# Patient Record
Sex: Female | Born: 1960 | Race: White | Hispanic: No | Marital: Married | Smoking: Never smoker
Health system: Southern US, Community
[De-identification: ages and names within clinical notes are randomized; demographics above are authoritative.]

## PROBLEM LIST (undated history)

## (undated) DIAGNOSIS — I10 Essential (primary) hypertension: Secondary | ICD-10-CM

---

## 2011-02-17 DIAGNOSIS — I10 Essential (primary) hypertension: Secondary | ICD-10-CM | POA: Insufficient documentation

## 2011-02-17 DIAGNOSIS — G47419 Narcolepsy without cataplexy: Secondary | ICD-10-CM | POA: Insufficient documentation

## 2011-06-25 DIAGNOSIS — G4730 Sleep apnea, unspecified: Secondary | ICD-10-CM | POA: Insufficient documentation

## 2011-09-15 DIAGNOSIS — N3941 Urge incontinence: Secondary | ICD-10-CM | POA: Insufficient documentation

## 2011-12-17 DIAGNOSIS — N398 Other specified disorders of urinary system: Secondary | ICD-10-CM | POA: Insufficient documentation

## 2012-08-16 DIAGNOSIS — J309 Allergic rhinitis, unspecified: Secondary | ICD-10-CM | POA: Insufficient documentation

## 2013-03-22 DIAGNOSIS — M1230 Palindromic rheumatism, unspecified site: Secondary | ICD-10-CM | POA: Insufficient documentation

## 2013-06-29 DIAGNOSIS — R079 Chest pain, unspecified: Secondary | ICD-10-CM | POA: Insufficient documentation

## 2014-11-18 DIAGNOSIS — C50919 Malignant neoplasm of unspecified site of unspecified female breast: Secondary | ICD-10-CM | POA: Insufficient documentation

## 2015-07-31 ENCOUNTER — Ambulatory Visit (INDEPENDENT_AMBULATORY_CARE_PROVIDER_SITE_OTHER): Payer: 59

## 2015-07-31 ENCOUNTER — Encounter: Payer: Self-pay | Admitting: Podiatry

## 2015-07-31 ENCOUNTER — Ambulatory Visit: Payer: Self-pay

## 2015-07-31 ENCOUNTER — Ambulatory Visit (INDEPENDENT_AMBULATORY_CARE_PROVIDER_SITE_OTHER): Payer: 59 | Admitting: Podiatry

## 2015-07-31 ENCOUNTER — Telehealth: Payer: Self-pay | Admitting: *Deleted

## 2015-07-31 VITALS — BP 135/69 | HR 69 | Resp 16

## 2015-07-31 DIAGNOSIS — E785 Hyperlipidemia, unspecified: Secondary | ICD-10-CM | POA: Insufficient documentation

## 2015-07-31 DIAGNOSIS — M722 Plantar fascial fibromatosis: Secondary | ICD-10-CM

## 2015-07-31 DIAGNOSIS — K760 Fatty (change of) liver, not elsewhere classified: Secondary | ICD-10-CM | POA: Insufficient documentation

## 2015-07-31 DIAGNOSIS — G4733 Obstructive sleep apnea (adult) (pediatric): Secondary | ICD-10-CM | POA: Insufficient documentation

## 2015-07-31 MED ORDER — METHYLPREDNISOLONE 4 MG PO TBPK: ORAL_TABLET | ORAL | Status: DC

## 2015-07-31 MED ORDER — MELOXICAM 15 MG PO TABS: 15.0000 mg | ORAL_TABLET | Freq: Every day | ORAL | Status: DC

## 2015-07-31 NOTE — Telephone Encounter (Signed)
CALLED PATIENT AND LEFT A MESSAGE STATING THAT THE PLANTAR FASCIA BRACES ARE HERE IN [place] (PATIENT WAS IN TO SEE DR HYATT TODAY). Courtney Glover

## 2015-07-31 NOTE — Patient Instructions (Signed)

## 2015-07-31 NOTE — Progress Notes (Signed)
   Subjective:    Patient ID: Courtney Glover, female    DOB: 08/11/1961, 54 y.o.   MRN: [id number]  HPI: She presents today as a 54 year old white female with a chief complaint of a one-year duration of pain to the right foot greater than the left. She states the pain is throbbing in nature along the medial longitudinal arch extending into the heel. 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 She states that she has been to see an orthopedist who recommended inserts and physical therapy. She states this seems did not seem to help. Currently she utilizes a Associate Professor.    Review of Systems  Musculoskeletal: Positive for gait problem.  All other systems reviewed and are negative.      Objective:   Physical Exam: 54 year old female in apparent good health vital signs stable alert and oriented 3. Pulses are strongly palpable neurologic sensorium is intact per Semmes-Weinstein monofilament. Deep tendon reflexes are intact bilateral and muscle strength was 5 over 5 dorsiflexion plantar flexors and inverters everters all intrinsic musculature is intact. Orthopedic evaluation demonstrates all joints distal to the ankle of full range of motion without crepitation. She has pain on palpation medial calcaneal tubercles bilateral. Orthopedic demonstrates no other significant findings. Radiographic evaluation does demonstrate soft tissue increase in density at the plantar fascial calcaneal insertion site of the bilateral heels. Cutaneous evaluation does not demonstrate any type of open lesions or wounds no cellulitis drainage or odor.      Assessment & Plan:  Assessment: Plantar fasciitis bilateral.  Plan: Discussed etiology pathology conservative versus surgical therapies. I encouraged her to continue to use her [place] sock. Started her on a Medrol Dosepak to be followed by meloxicam. I injected the bilateral heels today with Kenalog and local anesthetic and  dispensed to plantar fascial braces. We discussed appropriate shoe gear stretching exercises ice therapy as she can modifications I will follow up with her in 1 month.

## 2015-09-02 ENCOUNTER — Encounter: Payer: Self-pay | Admitting: Podiatry

## 2015-09-02 ENCOUNTER — Ambulatory Visit (INDEPENDENT_AMBULATORY_CARE_PROVIDER_SITE_OTHER): Payer: 59 | Admitting: Podiatry

## 2015-09-02 DIAGNOSIS — L603 Nail dystrophy: Secondary | ICD-10-CM | POA: Diagnosis not present

## 2015-09-02 DIAGNOSIS — M722 Plantar fascial fibromatosis: Secondary | ICD-10-CM

## 2015-09-02 NOTE — Progress Notes (Signed)
She presents today for a follow-up of her bilateral plantar fasciitis. She states that the left foot is approximately 90-95% improved. The right foot however is only approximately 50% improved. She purchased a new pair of new balance tennis shoes from the new balance store in [place]. She stated that the employees at the sporting good store suggested that she not wear her plantar fascial braces. So she has not worn them since they provided her with her new tennis shoes. She also states that she has not worn her [place]ussberg sock. She continues her medication. She is also concerned about discoloration of the hallux second and third nail plates right foot. She states that they have been discolored and thickened for quite some time and is concerned that she may have fungus.  Objective: Vital signs are stable she is alert and oriented 3. Pulses are strongly palpable bilateral. No reproducible pain on palpation medial calcaneal heel left. However the right foot does do a straight pain on palpation medial calcaneal tubercle right foot. No open lesions or wounds. All strength is normal. Cutaneous evaluation also demonstrates thick yellow possibly dystrophic or mycotic nail plate hallux second digit and third digit of the right foot.  Assessment: Nail dystrophy right foot. Well-healing plantar fasciitis left foot. Continue plantar fasciitis right foot.  Plan: Reinjected right heel today with Kenalog and local anesthetic. Sampled the nail and skin sent for pathologic evaluation. And I will follow-up with her once those labs have arrived.

## 2015-10-21 ENCOUNTER — Ambulatory Visit: Payer: 59 | Admitting: Podiatry

## 2016-01-20 ENCOUNTER — Telehealth: Payer: Self-pay | Admitting: *Deleted

## 2016-01-20 MED ORDER — MELOXICAM 15 MG PO TABS: 15.0000 mg | ORAL_TABLET | Freq: Every day | ORAL | Status: DC

## 2016-01-20 NOTE — Telephone Encounter (Signed)
Refill of Meloxicam.

## 2016-03-09 ENCOUNTER — Ambulatory Visit: Payer: 59 | Admitting: Podiatry

## 2016-03-16 ENCOUNTER — Ambulatory Visit: Payer: 59 | Admitting: Podiatry

## 2016-03-23 ENCOUNTER — Ambulatory Visit (INDEPENDENT_AMBULATORY_CARE_PROVIDER_SITE_OTHER): Payer: BLUE CROSS/BLUE SHIELD | Admitting: Podiatry

## 2016-03-23 ENCOUNTER — Encounter: Payer: Self-pay | Admitting: Podiatry

## 2016-03-23 DIAGNOSIS — M722 Plantar fascial fibromatosis: Secondary | ICD-10-CM | POA: Diagnosis not present

## 2016-03-23 NOTE — Progress Notes (Signed)
She presents today with chief complaint of bilateral heel pain. She states that on each shots and a night splint. She states that she is going have to do something more long-term because this is just bothering her she hasn't been here since January.  Objective: Vital signs are stable alert and oriented 3. Pulses are palpable. Chest pain on palpation medially continue tubercles bilateral. Left seems to be worse in the right. Vital signs are stable she is alert and oriented 3. Pulses are palpable neurologic sensorium is intact pain stress.  Assessment: Chronic intractable plantar fasciitis bilateral.  Plan: Injected the bilateral heels today. Dispensed a night splint for the left leg. She was scanned for orthotics.

## 2016-03-26 ENCOUNTER — Encounter: Payer: Self-pay | Admitting: *Deleted

## 2016-06-29 ENCOUNTER — Ambulatory Visit (INDEPENDENT_AMBULATORY_CARE_PROVIDER_SITE_OTHER): Payer: BLUE CROSS/BLUE SHIELD | Admitting: Podiatry

## 2016-06-29 DIAGNOSIS — M722 Plantar fascial fibromatosis: Secondary | ICD-10-CM | POA: Diagnosis not present

## 2016-06-30 ENCOUNTER — Telehealth: Payer: Self-pay | Admitting: *Deleted

## 2016-06-30 DIAGNOSIS — M722 Plantar fascial fibromatosis: Secondary | ICD-10-CM

## 2016-06-30 NOTE — Telephone Encounter (Addendum)
Faxed MRI orders to [facility]. 07/17/2016-Don[facility] states pt has Spinal cord stimulator and they can not perform MRI. I asked if they would perform CT and she said it may not show what Dr. Milinda Pointer is looking for. I ordered US of left foot. Faxed [place] order to [facility].

## 2016-06-30 NOTE — Progress Notes (Signed)
She presents today with little relief from her plantar fasciitis bilaterally. She's had multiple injections tried anti-inflammatories stretching and bracing to no avail. She also tried wearing her orthotics which states that they bother her.  Objective: Vital signs are stable alert and oriented 3. Pulses are strongly palpable. Neurologic system is intact. Deep tendon reflexes are intact. Muscle strength is 5 over 5 dorsi/plantar flexors and inverters everters all musculatures intact. Orthopedic evaluation demonstrates pain on palpation mucoid degenerative tubercle left greater than right.  Assessment: Severe intractable plantar fasciitis bilateral conservative therapies fail.  Plan: Requesting MRI of her left foot at this time suspect severe plantar fascitis. Once this comes back we will notify her as to our new treatment plan.

## 2016-07-17 ENCOUNTER — Ambulatory Visit: Admission: RE | Admit: 2016-07-17 | Payer: BLUE CROSS/BLUE SHIELD | Source: Ambulatory Visit

## 2016-07-29 ENCOUNTER — Encounter: Payer: Self-pay | Admitting: Podiatry

## 2016-07-29 ENCOUNTER — Ambulatory Visit (INDEPENDENT_AMBULATORY_CARE_PROVIDER_SITE_OTHER): Payer: BLUE CROSS/BLUE SHIELD | Admitting: Podiatry

## 2016-07-29 DIAGNOSIS — M722 Plantar fascial fibromatosis: Secondary | ICD-10-CM | POA: Diagnosis not present

## 2016-07-29 NOTE — Progress Notes (Signed)
She presents today for follow-up of her plantar fasciitis. She was unable to have an MRI because of the stimulator in her spine and a CT will not work for we need to see. I did request a ultrasound to be performed however they never contacted her for scheduling.  Objective: Vital signs are stable she's alert 3 severe pain on palpation medially intervals bilateral. pulses remain palpable.  Assessment: Chronic intractable plantar fasciitis bilateral.  Plan: Injected bilateral heels today with Kenalog and local anesthetic. I will request another ultrasound.

## 2016-07-30 ENCOUNTER — Telehealth: Payer: Self-pay | Admitting: *Deleted

## 2016-07-30 NOTE — Telephone Encounter (Addendum)
-----   Message from [place] sent at 07/29/2016 11:59 AM EST ----- Regarding: MRI/US report?? Dr Milinda Pointer is confused about what this pt actually had done. She is coming in today for MRI results but he says there are no MRI results in her chart. But then it says she went for [place] lower extremity, but don't see those results either. Can you help clarify? Or send results? 07/30/2016-I spoke with A. Prevette and she said pt did not have any radiology testing performed. I ordered US 07/17/2016 when [facility] scheduler called to change from MRI since pt has spinal stimulator. Left message explaining to pt the [place] order was given the same day the MRI was cancelled and [facility] should have called to schedule her. I apologized for the delay and told pt the orders were still in affect and she would be able to have performed.

## 2016-08-11 ENCOUNTER — Ambulatory Visit: Payer: BLUE CROSS/BLUE SHIELD

## 2016-09-02 ENCOUNTER — Ambulatory Visit: Payer: BLUE CROSS/BLUE SHIELD | Admitting: Podiatry

## 2016-09-06 ENCOUNTER — Encounter: Payer: Self-pay | Admitting: Podiatry

## 2016-10-22 ENCOUNTER — Telehealth: Payer: Self-pay | Admitting: *Deleted

## 2016-10-22 ENCOUNTER — Ambulatory Visit
Admission: RE | Admit: 2016-10-22 | Discharge: 2016-10-22 | Disposition: A | Discharge status: Home / Self Care | Payer: BLUE CROSS/BLUE SHIELD | Source: Ambulatory Visit | Attending: Podiatry | Admitting: Podiatry

## 2016-10-22 ENCOUNTER — Other Ambulatory Visit: Payer: Self-pay | Admitting: Podiatry

## 2016-10-22 DIAGNOSIS — M722 Plantar fascial fibromatosis: Secondary | ICD-10-CM

## 2016-10-22 NOTE — Telephone Encounter (Addendum)
Courtney Glover - [facility] states pt says right foot plantar fascia hurts too. Dr. Milinda Pointer okayed right [place]. Unable to fax due to printer down.11/19/2016-Courtney Glover PT request pt's demographics. Faxed demographics to [facility].

## 2016-11-13 ENCOUNTER — Ambulatory Visit (INDEPENDENT_AMBULATORY_CARE_PROVIDER_SITE_OTHER): Payer: BLUE CROSS/BLUE SHIELD | Admitting: Podiatry

## 2016-11-13 ENCOUNTER — Encounter: Payer: Self-pay | Admitting: Podiatry

## 2016-11-13 DIAGNOSIS — M722 Plantar fascial fibromatosis: Secondary | ICD-10-CM

## 2016-11-13 NOTE — Progress Notes (Signed)
She presents today with her husband for follow-up of her plantar fasciitis bilateral. She states this seems to be just getting worse as time goes on and I'm going to have to do something about this.  Objective: Vital signs are stable alert and oriented 3. Pulses are palpable. She still has pain on palpation medially continued tubercles bilateral left greater than the right. She has started to develop some Achilles tendinitis as well. Ultrasound report states that this is let her fasciitis with no signs of tear. This is noted to be bilateral.  Assessment: Plantar fasciitis with compensatory Achilles tendinitis bilateral.  Plan: We discussed the pros and cons of surgical intervention at this time she would like to try physical therapy should physical therapy fell endoscopic plantar fasciotomy will be her only recourse. She does not want to try anything that is not covered by insurance. She will start physical therapy as to its physical therapy in [place]

## 2016-11-18 ENCOUNTER — Telehealth: Payer: Self-pay

## 2016-11-18 NOTE — Telephone Encounter (Signed)
Order has been faxed to [facility] physical therapy in [place].

## 2016-11-23 ENCOUNTER — Ambulatory Visit: Payer: BLUE CROSS/BLUE SHIELD | Admitting: Podiatry

## 2016-12-16 ENCOUNTER — Encounter: Payer: Self-pay | Admitting: Podiatry

## 2016-12-17 ENCOUNTER — Encounter: Payer: Self-pay | Admitting: Podiatry

## 2016-12-18 ENCOUNTER — Telehealth: Payer: Self-pay | Admitting: Podiatry

## 2016-12-18 NOTE — Telephone Encounter (Signed)
I would like to go ahead and schedule surgery for my foot. ASAP

## 2016-12-21 NOTE — Telephone Encounter (Signed)
Have her in for a consult if she is not scheduled.

## 2016-12-21 NOTE — Telephone Encounter (Signed)
Left message requesting pt for pt to schedule an appt for consultation.

## 2017-01-04 ENCOUNTER — Encounter: Payer: Self-pay | Admitting: Podiatry

## 2017-01-12 ENCOUNTER — Telehealth: Payer: Self-pay | Admitting: *Deleted

## 2017-01-12 NOTE — Telephone Encounter (Signed)
I left patient a message that I can schedule her tentatively for June 15 or June 22.  I asked her to give me a call back.

## 2017-01-14 NOTE — Telephone Encounter (Signed)
I got your message and I put you down tentatively for June 22.  "Great, will you tell me what time and where it's going to be?"  You'll get that information when you go for your consultation with Dr. Milinda Pointer.  "Okay, thank you."

## 2017-01-14 NOTE — Telephone Encounter (Signed)
"  I was just returning your call.  Let's do it on June 22 to be safe.  Give me a call, text or email to let me know the time that would be good.  Thank you."

## 2017-01-18 ENCOUNTER — Ambulatory Visit: Payer: BLUE CROSS/BLUE SHIELD | Admitting: Podiatry

## 2017-01-25 ENCOUNTER — Ambulatory Visit (INDEPENDENT_AMBULATORY_CARE_PROVIDER_SITE_OTHER): Payer: BLUE CROSS/BLUE SHIELD | Admitting: Podiatry

## 2017-01-25 ENCOUNTER — Encounter: Payer: Self-pay | Admitting: Podiatry

## 2017-01-25 DIAGNOSIS — M722 Plantar fascial fibromatosis: Secondary | ICD-10-CM

## 2017-01-25 NOTE — Progress Notes (Signed)
She presents today for surgical consult regarding her left foot and leg. She states that the plantar fasciitis is going along enough and conservative therapy such as physical therapy and injection therapy oral medications have all failed. She states that she will something more permanent. We discussed her past medical history medications and allergies she states that she is no longer taking her cancer medication and that she is doing very well. She recently had a scan and no recurrence.  Objective: I have reviewed her past medical history medications out a surgery social history surgical history. Pulses are palpable. Neurologic sensorium is intact. Degenerative flexors are intact. She has gastroc equinus. She also has pain on palpation of the medial band of the plantar fascia of the left heel. Multiple varicosities are noted about the leg.  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  Plan: Discussed etiology pathology conservative versus surgical therapies. At this point considering her today for an endoscopic plantar fasciotomy of the left heel as well as a gastroc recession of the left leg. She understands that she will be in a cam walker for a period of up to 8 weeks' and she will be working from home. We discussed general anesthesia and a sciatic nerve block. I will follow-up with her in the near future for surgical intervention.

## 2017-01-25 NOTE — Patient Instructions (Signed)
Pre-Operative Instructions  Congratulations, you have decided to take an important step to improving your quality of life.  You can be assured that the doctors of [facility] will be with you every step of the way.  1. Plan to be at the surgery center/hospital at least 1 (one) hour prior to your scheduled time unless otherwise directed by the surgical center/hospital staff.  You must have a responsible adult accompany you, remain during the surgery and drive you home.  Make sure you have directions to the surgical center/hospital and know how to get there on time. 2. For hospital based surgery you will need to obtain a history and physical form from your family physician within 1 month prior to the date of surgery- we will give you a form for you primary physician.  3. We make every effort to accommodate the date you request for surgery.  There are however, times where surgery dates or times have to be moved.  We will contact you as soon as possible if a change in schedule is required.   4. No Aspirin/Ibuprofen for one week before surgery.  If you are on aspirin, any non-steroidal anti-inflammatory medications (Mobic, Aleve, Ibuprofen) you should stop taking it 7 days prior to your surgery.  You make take Tylenol  For pain prior to surgery.  5. Medications- If you are taking daily heart and blood pressure medications, seizure, reflux, allergy, asthma, anxiety, pain or diabetes medications, make sure the surgery center/hospital is aware before the day of surgery so they may notify you which medications to take or avoid the day of surgery. 6. No food or drink after midnight the night before surgery unless directed otherwise by surgical center/hospital staff. 7. No alcoholic beverages 24 hours prior to surgery.  No smoking 24 hours prior to or 24 hours after surgery. 8. Wear loose pants or shorts- loose enough to fit over bandages, boots, and casts. 9. No slip on shoes, sneakers are best. 10. Bring  your boot with you to the surgery center/hospital.  Also bring crutches or a walker if your physician has prescribed it for you.  If you do not have this equipment, it will be provided for you after surgery. 11. If you have not been contracted by the surgery center/hospital by the day before your surgery, call to confirm the date and time of your surgery. 12. Leave-time from work may vary depending on the type of surgery you have.  Appropriate arrangements should be made prior to surgery with your employer. 13. Prescriptions will be provided immediately following surgery by your doctor.  Have these filled as soon as possible after surgery and take the medication as directed. 14. Remove nail polish on the operative foot. 15. Wash the night before surgery.  The night before surgery wash the foot and leg well with the antibacterial soap provided and water paying special attention to beneath the toenails and in between the toes.  Rinse thoroughly with water and dry well with a towel.  Perform this wash unless told not to do so by your physician.  Enclosed: 1 Ice pack (please put in freezer the night before surgery)   1 Hibiclens skin cleaner   Pre-op Instructions  If you have any questions regarding the instructions, do not hesitate to call our office.  [place]: [address] [place], [place] [address] [phone]  [place]: [address] [place], [place] [address] [phone]  [employer]: [address]  [place], [place] [address] [phone]   Dr.   Norman Regal DPM, Dr. Matthew Wagoner DPM, Dr. M. Todd Hyatt DPM, Dr. Titorya Stover DPM 

## 2017-01-26 ENCOUNTER — Telehealth: Payer: Self-pay | Admitting: *Deleted

## 2017-01-26 NOTE — Telephone Encounter (Signed)
"  I'm calling to double check on the date of my surgery with Dr. Milinda Pointer on my foot.  I think it was the 21st or 22nd.  I want to confirm that.  If you could call me if I don't answer leave me a message and let me know when that procedure is."  I'm returning your call.  You're scheduled for June 22.  "Thank you so much, that is all I needed to know."

## 2017-02-02 ENCOUNTER — Encounter: Payer: Self-pay | Admitting: Podiatry

## 2017-02-04 ENCOUNTER — Other Ambulatory Visit: Payer: Self-pay | Admitting: Podiatry

## 2017-02-04 MED ORDER — OXYCODONE-ACETAMINOPHEN 10-325 MG PO TABS: 1.0000 | ORAL_TABLET | Freq: Four times a day (QID) | ORAL | 0 refills | Status: DC | PRN

## 2017-02-04 MED ORDER — PROMETHAZINE HCL 25 MG PO TABS: 25.0000 mg | ORAL_TABLET | Freq: Three times a day (TID) | ORAL | 0 refills | Status: AC | PRN

## 2017-02-05 ENCOUNTER — Encounter: Payer: Self-pay | Admitting: Podiatry

## 2017-02-05 DIAGNOSIS — M722 Plantar fascial fibromatosis: Secondary | ICD-10-CM | POA: Diagnosis not present

## 2017-02-05 DIAGNOSIS — M216X2 Other acquired deformities of left foot: Secondary | ICD-10-CM | POA: Diagnosis not present

## 2017-02-08 ENCOUNTER — Telehealth: Payer: Self-pay | Admitting: Podiatry

## 2017-02-08 ENCOUNTER — Telehealth: Payer: Self-pay | Admitting: *Deleted

## 2017-02-08 DIAGNOSIS — M722 Plantar fascial fibromatosis: Secondary | ICD-10-CM

## 2017-02-08 NOTE — Telephone Encounter (Signed)
Patient had surgery on Friday, would like an order for a knee scooter. Please call her at home number.

## 2017-02-08 NOTE — Telephone Encounter (Signed)
"  I need a knee scooter.  I have one ordered from [facility] but they need a prescription.  Tammy from the [facility] office told me to call you."  I will put the order in an mail it to you but that will take a couple of days.  "Is there any way you can email to me, scan it?"  I will try.

## 2017-02-10 NOTE — Telephone Encounter (Signed)
Tammy informed me on Monday that she had taken care of patient.

## 2017-02-12 ENCOUNTER — Encounter: Payer: Self-pay | Admitting: Podiatry

## 2017-02-12 ENCOUNTER — Ambulatory Visit: Payer: BLUE CROSS/BLUE SHIELD

## 2017-02-12 ENCOUNTER — Ambulatory Visit (INDEPENDENT_AMBULATORY_CARE_PROVIDER_SITE_OTHER): Payer: Self-pay | Admitting: Podiatry

## 2017-02-12 DIAGNOSIS — Z9889 Other specified postprocedural states: Secondary | ICD-10-CM

## 2017-02-12 NOTE — Progress Notes (Signed)
DOS 06.22.2018   1. Endoscopic plantar fasciotomy left.   2. Gastrocnemius recession left leg.

## 2017-02-12 NOTE — Progress Notes (Signed)
   Subjective:  Patient presents today status post gastroc recession and EPF of the left lower extremity. Date of surgery 02/05/2017 performed by Dr. Milinda Pointer. Patient states that she is feeling very well. She is not currently putting pressure on her left lower extremity. Patient otherwise has no significant complaints other than right foot pain secondary to compensating   Objective/Physical Exam Skin incisions appear to be well coapted with sutures and staples intact. No sign of infectious process noted. No dehiscence. No active bleeding noted. Negative for any significant edema noted to the surgical extremity.  Assessment: 1. s/p gastroc recession with EPF left lower extremity. DOS: 02/05/2017   Plan of Care:  1. Patient was evaluated.  2. Today dressings were changed. Keep dressings clean dry and intact for an additional week. Continue nonweightbearing with the knee scooter for an additional week. 3. Return to clinic in 1 week for suture and staple removal   Edrick Kins, DPM [facility]  Dr. Edrick Kins, [address]                                        [place], [place] [address]                Office [phone]  Fax [phone]

## 2017-02-19 ENCOUNTER — Ambulatory Visit (INDEPENDENT_AMBULATORY_CARE_PROVIDER_SITE_OTHER): Payer: Self-pay | Admitting: Podiatry

## 2017-02-19 ENCOUNTER — Encounter: Payer: Self-pay | Admitting: Podiatry

## 2017-02-19 DIAGNOSIS — M722 Plantar fascial fibromatosis: Secondary | ICD-10-CM

## 2017-02-23 NOTE — Progress Notes (Signed)
   Subjective:  Patient presents today status post gastroc recession and EPF of the left lower extremity. Date of surgery 02/05/2017 performed by Dr. Milinda Pointer. Patient states that she is feeling very well and denies any new complaints at this time.   Objective/Physical Exam Skin incisions appear to be well coapted with sutures and staples intact. No sign of infectious process noted. No dehiscence. No active bleeding noted. Negative for any significant edema noted to the surgical extremity.  Assessment: 1. s/p gastroc recession with EPF left lower extremity. DOS: 02/05/2017   Plan of Care:  1. Patient was evaluated.  2. Sutures and staples removed today. 3. Continue nonweightbearing in CAM boot. 4. Return to clinic in 2 weeks with Dr. Milinda Pointer.   Edrick Kins, DPM [facility]  Dr. Edrick Kins, [place]                                        [place], [place] [address]                Office [phone]  Fax [phone]

## 2017-03-03 ENCOUNTER — Encounter: Payer: Self-pay | Admitting: Podiatry

## 2017-03-03 ENCOUNTER — Ambulatory Visit (INDEPENDENT_AMBULATORY_CARE_PROVIDER_SITE_OTHER): Payer: BLUE CROSS/BLUE SHIELD | Admitting: Podiatry

## 2017-03-03 DIAGNOSIS — M722 Plantar fascial fibromatosis: Secondary | ICD-10-CM

## 2017-03-03 DIAGNOSIS — Z9889 Other specified postprocedural states: Secondary | ICD-10-CM

## 2017-03-03 NOTE — Progress Notes (Signed)
She presents today for follow-up of her gastroc recession and endoscopic fasciotomy date of surgery 02/05/2017 states that is doing much better I don't have much pain now at all she continues to wear her Cam Walker take ibuprofen and use her knee scooter. She denies chest pain Pain and shortness of breath.  Objective: Vital signs are stable she is alert and oriented 3. She has no reproducible pain on palpation medially continue tubercle some tenderness on palpation of the incision site of the gastroc recession. There's no signs of infection margins are well coapted.  Assessment: Well-healing surgical leg left.  Plan: Cam Walker she will continue to wear and I encouraged her to start walking with the cam walker and use the scooter S. I'll follow-up with her in about 3 weeks.

## 2017-03-17 ENCOUNTER — Encounter: Payer: Self-pay | Admitting: Podiatry

## 2017-03-17 ENCOUNTER — Ambulatory Visit (INDEPENDENT_AMBULATORY_CARE_PROVIDER_SITE_OTHER): Payer: Self-pay | Admitting: Podiatry

## 2017-03-17 DIAGNOSIS — Z9889 Other specified postprocedural states: Secondary | ICD-10-CM

## 2017-03-17 DIAGNOSIS — M722 Plantar fascial fibromatosis: Secondary | ICD-10-CM

## 2017-03-17 NOTE — Progress Notes (Signed)
She presents today for follow-up of her gastroc recession and endoscopic fasciotomies been about 6 weeks now. She states that she is doing good.  Objective: Vital signs extension alert and 3 there's minimal calf pain. Absolutely no pain on plantar aspect of the left heel.  Assessment: Well-healing surgical foot.  Plan: I instructed her to begin walking utilizing her cam walker with no support and then transition into a pair of tennis shoes over the next 2-3 weeks. I'll follow-up with her at that time which time we hope to be back into regular tennis shoes with her orthotics.

## 2017-03-22 ENCOUNTER — Encounter: Payer: BLUE CROSS/BLUE SHIELD | Admitting: Podiatry

## 2017-04-07 ENCOUNTER — Encounter: Payer: BLUE CROSS/BLUE SHIELD | Admitting: Podiatry

## 2017-05-13 ENCOUNTER — Emergency Department: Payer: BLUE CROSS/BLUE SHIELD

## 2017-05-13 ENCOUNTER — Ambulatory Visit (INDEPENDENT_AMBULATORY_CARE_PROVIDER_SITE_OTHER)
Admission: EM | Admit: 2017-05-13 | Discharge: 2017-05-13 | Disposition: A | Discharge status: Home / Self Care | Payer: BLUE CROSS/BLUE SHIELD | Source: Home / Self Care | Attending: Family Medicine | Admitting: Family Medicine

## 2017-05-13 ENCOUNTER — Encounter: Payer: Self-pay | Admitting: *Deleted

## 2017-05-13 ENCOUNTER — Emergency Department
Admission: EM | Admit: 2017-05-13 | Discharge: 2017-05-13 | Disposition: A | Discharge status: Home / Self Care | Payer: BLUE CROSS/BLUE SHIELD | Attending: Emergency Medicine | Admitting: Emergency Medicine

## 2017-05-13 DIAGNOSIS — L509 Urticaria, unspecified: Secondary | ICD-10-CM

## 2017-05-13 DIAGNOSIS — R079 Chest pain, unspecified: Secondary | ICD-10-CM

## 2017-05-13 DIAGNOSIS — E785 Hyperlipidemia, unspecified: Secondary | ICD-10-CM

## 2017-05-13 DIAGNOSIS — R059 Cough, unspecified: Secondary | ICD-10-CM

## 2017-05-13 DIAGNOSIS — R071 Chest pain on breathing: Secondary | ICD-10-CM | POA: Diagnosis not present

## 2017-05-13 DIAGNOSIS — R05 Cough: Secondary | ICD-10-CM | POA: Insufficient documentation

## 2017-05-13 DIAGNOSIS — Z79899 Other long term (current) drug therapy: Secondary | ICD-10-CM | POA: Insufficient documentation

## 2017-05-13 DIAGNOSIS — I10 Essential (primary) hypertension: Secondary | ICD-10-CM | POA: Diagnosis not present

## 2017-05-13 HISTORY — DX: Essential (primary) hypertension: I10

## 2017-05-13 LAB — TROPONIN I
Troponin I: <0.03 ng/mL (ref <0.03)
Troponin I: <0.03 ng/mL (ref <0.03)

## 2017-05-13 LAB — BASIC METABOLIC PANEL
Anion gap: 12 (ref 5–15)
BUN: 19 mg/dL (ref 6–20)
CHLORIDE: 102 mmol/L (ref 101–111)
CO2: 23 mmol/L (ref 22–32)
CREATININE: 0.72 mg/dL (ref 0.44–1.00)
Calcium: 8.3 mg/dL — ABNORMAL LOW (ref 8.9–10.3)
GFR calc Af Amer: >60 mL/min (ref >60)
GFR calc non Af Amer: >60 mL/min (ref >60)
Glucose, Bld: 118 mg/dL — ABNORMAL HIGH (ref 65–99)
Potassium: 3.3 mmol/L — ABNORMAL LOW (ref 3.5–5.1)
SODIUM: 137 mmol/L (ref 135–145)

## 2017-05-13 LAB — HEPATIC FUNCTION PANEL
ALT: 29 U/L (ref 14–54)
AST: 22 U/L (ref 15–41)
Albumin: 3.3 g/dL — ABNORMAL LOW (ref 3.5–5.0)
Alkaline Phosphatase: 55 U/L (ref 38–126)
BILIRUBIN DIRECT: 0.1 mg/dL (ref 0.1–0.5)
BILIRUBIN INDIRECT: 0.5 mg/dL (ref 0.3–0.9)
BILIRUBIN TOTAL: 0.6 mg/dL (ref 0.3–1.2)
Total Protein: 5.6 g/dL — ABNORMAL LOW (ref 6.5–8.1)

## 2017-05-13 LAB — CBC
HCT: 36.9 % (ref 35.0–47.0)
Hemoglobin: 12.7 g/dL (ref 12.0–16.0)
MCH: 31.9 pg (ref 26.0–34.0)
MCHC: 34.5 g/dL (ref 32.0–36.0)
MCV: 92.3 fL (ref 80.0–100.0)
PLATELETS: 150 10*3/uL (ref 150–440)
RBC: 4 MIL/uL (ref 3.80–5.20)
RDW: 15 % — AB (ref 11.5–14.5)
WBC: 13.8 10*3/uL — AB (ref 3.6–11.0)

## 2017-05-13 LAB — LIPASE, BLOOD: LIPASE: 25 U/L (ref 11–51)

## 2017-05-13 LAB — FIBRIN DERIVATIVES D-DIMER (ARMC ONLY): FIBRIN DERIVATIVES D-DIMER (ARMC): 1421.26 ng{FEU}/mL — AB (ref 0.00–499.00)

## 2017-05-13 MED ORDER — KETOROLAC TROMETHAMINE 30 MG/ML IJ SOLN
30.0000 mg | Freq: Once | INTRAMUSCULAR | Status: AC
  Administered 2017-05-13: 30 mg via INTRAVENOUS
  Filled 2017-05-13: qty 1

## 2017-05-13 MED ORDER — IOPAMIDOL (ISOVUE-370) INJECTION 76%
75.0000 mL | Freq: Once | INTRAVENOUS | Status: AC | PRN
  Administered 2017-05-13: 75 mL via INTRAVENOUS

## 2017-05-13 MED ORDER — ASPIRIN 81 MG PO CHEW
324.0000 mg | CHEWABLE_TABLET | Freq: Once | ORAL | Status: AC
  Administered 2017-05-13: 324 mg via ORAL

## 2017-05-13 MED ORDER — DIPHENHYDRAMINE HCL 50 MG/ML IJ SOLN
12.5000 mg | Freq: Once | INTRAMUSCULAR | Status: AC
  Administered 2017-05-13: 12.5 mg via INTRAVENOUS
  Filled 2017-05-13: qty 1

## 2017-05-13 MED ORDER — DEXAMETHASONE SODIUM PHOSPHATE 10 MG/ML IJ SOLN
10.0000 mg | Freq: Once | INTRAMUSCULAR | Status: AC
  Administered 2017-05-13: 10 mg via INTRAVENOUS
  Filled 2017-05-13: qty 1

## 2017-05-13 MED ORDER — ALBUTEROL SULFATE (2.5 MG/3ML) 0.083% IN NEBU
5.0000 mg | INHALATION_SOLUTION | Freq: Once | RESPIRATORY_TRACT | Status: AC
  Administered 2017-05-13: 5 mg via RESPIRATORY_TRACT
  Filled 2017-05-13: qty 6

## 2017-05-13 MED ORDER — GUAIFENESIN-CODEINE 100-10 MG/5ML PO SOLN: 5.0000 mL | Freq: Four times a day (QID) | ORAL | 0 refills | Status: DC | PRN

## 2017-05-13 MED ORDER — IPRATROPIUM BROMIDE 0.02 % IN SOLN
0.5000 mg | Freq: Once | RESPIRATORY_TRACT | Status: AC
  Administered 2017-05-13: 0.5 mg via RESPIRATORY_TRACT
  Filled 2017-05-13: qty 2.5

## 2017-05-13 NOTE — ED Provider Notes (Signed)
-----------------------------------------   10:01 PM on 05/13/2017 -----------------------------------------  Patient care is him from Dr. Burlene Arnt. Patient's repeat troponin is negative. Patient's workup has been largely reassuring she did have a significantly elevated d-dimer however CT angiography of the chest is negative for PE.given the patient's overall normal workup I believe the patient is safe for discharge home with cardiology follow-up. I discussed this plan of care with the patient is agreeable to this plan. patient complains follow-up with [facility] where her husband currently follows up.   Harvest Dark, MD 05/13/17 2204

## 2017-05-13 NOTE — ED Provider Notes (Signed)
[facility]    CSN: [id number] Arrival date & time: 05/13/17  1309     History   Chief Complaint Chief Complaint  Patient presents with  . Chest Pain    HPI Ernestene Coover is a 56 y.o. female.   Patient is a 56 year old white female who is experiencing chest pain. Chest pain started last night has persisted the night on gotten worse. 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 She does not smoke is no radiation of the pain was in the midst of her chest and is feels deep. She reports when she takes deep breaths that makes it worse as well. She does not smoke but she does have hyperlipidemia and hypertension. Mother had pacemaker inserted in her 87s and father has had open heart surgery before in the past. She never smoked. Is a history of back surgery and she is a stimulator now. She has chronic urticaria   The history is provided by the patient. No language interpreter was used.  Chest Pain  Pain location:  Substernal area Pain quality: crushing, pressure and sharp   Pain quality: not radiating   Pain radiates to:  Does not radiate Pain severity:  Severe Duration:  12 hours Timing:  Constant Progression:  Unchanged Chronicity:  New Context: breathing   Worsened by:  Coughing, deep breathing and movement Associated symptoms: back pain     History reviewed. No pertinent past medical history.  Patient Active Problem List   Diagnosis Date Noted  . Fatty infiltration of liver 07/31/2015  . HLD (hyperlipidemia) 07/31/2015  . Obstructive apnea 07/31/2015  . Malignant neoplasm of female breast ([place]) 11/18/2014  . Chest pain 06/29/2013  . 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 Allergic rhinitis 08/16/2012  . Dysfunctional voiding of urine 12/17/2011  . Urge incontinence of urine 09/15/2011  . Apnea, sleep 06/25/2011  . Benign essential HTN 02/17/2011  . 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 No pertinent  surgical history.  OB History    No data available       Home Medications    Prior to Admission medications   Medication Sig Start Date End Date Taking? Authorizing Provider  albuterol (PROAIR HFA) 108 (90 BASE) MCG/ACT inhaler Inhale into the lungs. 09/14/13  Yes [provider]  Armodafinil 250 MG tablet  06/24/15  Yes [provider]  cetirizine (ZYRTEC) 10 MG tablet Take by mouth.   Yes [provider]  citalopram (CELEXA) 20 MG tablet  06/24/15  Yes [provider]  Docusate Sodium 100 MG capsule Take by mouth.   Yes [provider]  fluocinonide (LIDEX) 0.05 % external solution APPLY TO AFFECTED AREA TWICE DAILY AS NEEDED 04/16/14  Yes [provider]  fluticasone (FLONASE) 50 MCG/ACT nasal spray  06/06/15  Yes [provider]  ibuprofen (ADVIL,MOTRIN) 800 MG tablet TAKE 1 TABLET BY MOUTH EVERY 8 HOURS AS NEEDED FOR PAIN 10/08/14  Yes [provider]  lisinopril-hydrochlorothiazide (PRINZIDE,ZESTORETIC) 10-12.5 MG tablet  06/24/15  Yes [provider]  metaxalone (SKELAXIN) 800 MG tablet Take by mouth. 11/24/10  Yes [provider]  Misc Natural Products [employer]) CAPS Take by mouth. 02/18/17  Yes [provider]  montelukast (SINGULAIR) 10 MG tablet TAKE 1 TABLET BY MOUTH AT BEDTIME 12/11/14  Yes [provider]  MYRBETRIQ 50 MG TB24 tablet  03/01/17  Yes [provider]  omalizumab Arvid Right) 150 MG injection Inject into the skin every  14 (fourteen) days.   Yes [provider]  ranitidine (PX RANITIDINE) 75 MG tablet Take by mouth.   Yes [provider]  simvastatin (ZOCOR) 20 MG tablet  06/24/15  Yes [provider]  vitamin B-12 (CYANOCOBALAMIN) 1000 MCG tablet Take by mouth. 02/18/17 02/18/18 Yes [provider]  ASACOL HD 800 MG TBEC  05/23/15   [provider]  GAVILYTE-C 240 g solution  02/01/17   [provider]  HYDROcodone-acetaminophen (VICODIN) 5-500 MG tablet as needed.  08/01/12   [provider]  hydroxychloroquine (PLAQUENIL) 200 MG tablet  06/24/15   [provider]  hydrOXYzine (ATARAX/VISTARIL) 25 MG tablet  03/01/17   [provider]  meloxicam (MOBIC) 15 MG tablet Take 1 tablet (15 mg total) by mouth daily. 01/20/16   Hyatt, Max T, DPM  oxyCODONE-acetaminophen (PERCOCET) 10-325 MG tablet Take 1 tablet by mouth every 6 (six) hours as needed for pain. 02/04/17   Hyatt, Max T, DPM  PENNSAID 2 % SOLN APPLY 2 PUMPS (2 GRAMS) TO AFFECTED AREA TOPICALLY TWICE DAILY AS DIRECTED 05/16/15   [provider]  predniSONE (DELTASONE) 20 MG tablet  02/22/17   [provider]  promethazine (PHENERGAN) 25 MG tablet Take 1 tablet (25 mg total) by mouth every 8 (eight) hours as needed. 02/04/17   Hyatt, Max T, DPM  tamoxifen (NOLVADEX) 20 MG tablet  06/24/15   [provider]  topiramate (TOPAMAX) 50 MG tablet  06/06/15   [provider]  VESICARE 5 MG tablet  06/24/15   [provider]    Family History History reviewed. No pertinent family history.  Social History Social History  Substance Use Topics  . Smoking status: Never Smoker  . Smokeless tobacco: Never Used  . Alcohol use No     Allergies   Other   Review of Systems Review of Systems  Respiratory: Negative for apnea and wheezing.   Cardiovascular: Positive for chest pain.  Musculoskeletal: Positive for back pain.  All other systems reviewed and are negative.    Physical Exam Triage Vital Signs ED Triage Vitals  Enc Vitals Group     BP 05/13/17 1321 (!) 112/44     Pulse Rate 05/13/17 1321 71     Resp 05/13/17 1321 18     Temp 05/13/17 1321 98.1 F (36.7 C)     Temp Source 05/13/17 1321 Oral     SpO2 05/13/17 1321 97 %     Weight 05/13/17 1316 215 lb (97.5 kg)     Height 05/13/17 1316 5\' 9"  (1.753 m)     Head Circumference --      Peak Flow --      Pain  Score 05/13/17 1316 8     Pain Loc --      Pain Edu? --      Excl. in [place]? --    No data found.   Updated Vital Signs BP (!) 112/44 (BP Location: Left Arm)   Pulse 71   Temp 98.1 F (36.7 C) (Oral)   Resp 18   Ht 5\' 9"  (1.753 m)   Wt 215 lb (97.5 kg)   SpO2 97%   BMI 31.75 kg/m   Visual Acuity Right Eye Distance:   Left Eye Distance:   Bilateral Distance:    Right Eye Near:   Left Eye Near:    Bilateral Near:     Physical Exam  Constitutional: She is oriented to person, place, and time. She  appears well-developed and well-nourished. No distress.  HENT:  Head: Normocephalic and atraumatic.  Right Ear: External ear normal.  Left Ear: External ear normal.  Nose: Nose normal.  Mouth/Throat: Oropharynx is clear and moist.  Eyes: Pupils are equal, round, and reactive to light. Conjunctivae are normal.  Neck: Normal range of motion. Neck supple.  Cardiovascular: Normal rate, regular rhythm and normal heart sounds.   No murmur heard. Pulmonary/Chest: Effort normal and breath sounds normal.  Musculoskeletal: Normal range of motion.  Lymphadenopathy:    She has cervical adenopathy.  Neurological: She is alert and oriented to person, place, and time. No cranial nerve deficit.  Skin: Skin is warm. She is not diaphoretic.  Psychiatric: She has a normal mood and affect.  Vitals reviewed.    UC Treatments / Results  Labs (all labs ordered are listed, but only abnormal results are displayed) Labs Reviewed - No data to display  EKG  EKG Interpretation None      ED ECG REPORT I, [place] Throckmorton H, the attending physician, personally viewed and interpreted this ECG.   Date: 05/13/2017  EKG Time: [phone]  Rate: 71  Rhythm: there are no previous tracings available for comparison, normal sinus rhythm  Axis: 18  Intervals:none  ST&T Change: Questionable ST changes but unable to say for sure due to the artifact present on this EKG  . On the electronic reading artifact  makes reading this EKG unreliable.   Radiology No results found.  Procedures Procedures (including critical care time)  Medications Ordered in UC Medications  aspirin chewable tablet 324 mg (324 mg Oral Given 05/13/17 1346)     Initial Impression / Assessment and Plan / UC Course  I have reviewed the triage vital signs and the nursing notes.  Pertinent labs & imaging results that were available during my care of the patient were reviewed by me and considered in my medical decision making (see chart for details).     Unable to reproduce the chest pain with palpation the chest will send to [facility] ED for evaluation. Discussed with charge nurse Colletta Maryland and she is aware of patient's pending arrival. EMS was called setting lock was ordered and paper she was given 4 baby aspirin. A portion due to stimulator EKG was unreadable  Final Clinical Impressions(s) / UC Diagnoses   Final diagnoses:  Chest pain on breathing  Chest pain, unspecified type    New Prescriptions Discharge Medication List as of 05/13/2017  2:10 PM     Note: This dictation was prepared with Dragon dictation along with smaller phrase technology. Any transcriptional errors that result from this process are unintentional.  Controlled Substance Prescriptions [place] Controlled Substance Registry consulted? Not Applicable   Frederich Cha, MD 05/13/17 [phone]

## 2017-05-13 NOTE — ED Triage Notes (Addendum)
Pt to ED from [facility] urgent care reporting centralized chest pressure 8/10 that began suddenly last night. Pt reports she was able to fall asleep but woke at 3:00 am and pain had not decreased. Pt reports SOB but denies cough. Decreased lower lung sounds bilaterally. Pt denies fevers. Pain is reported to radiate to pts back. Pt has chronic back pain.   324 ASA given with EMS

## 2017-05-13 NOTE — Discharge Instructions (Addendum)
You have been seen in the emergency department today for chest pain. Your workup has shown normal results. As we discussed please follow-up with your primary care physician in the next 1-2 days for recheck. Return to the emergency department for any further chest pain, trouble breathing, or any other symptom personally concerning to yourself. °

## 2017-05-13 NOTE — ED Provider Notes (Addendum)
[facility]  ____________________________________________   I have reviewed the triage vital signs and the nursing notes.   HISTORY  Chief Complaint Chest Pain    HPI Courtney Glover is a 56 y.o. female Who has a history of hypertension and chronic hives,among other medical problems presents today complaining of pleuritic chest pain. She has no nausea or vomiting. Her sat take a deep breath. She has a cough as well. No productive cough no fever. Hurts to cough. This is been going on for the last day or so. She denies any leg swelling, no recent travel or personal or family history of PE or DVT, no leg swelling, aside from taking a deep breath and coughing nothing makesthe pain worse, it is mildly positional however. Nothing makes it better. She has had no prior intervention, patient has no anaphylactic symptoms, no wheeze, no swelling no angioedema, and her chronic hives are unchanged from baseline    Past Medical History:  Diagnosis Date  . Hypertension     Patient Active Problem List   Diagnosis Date Noted  . Fatty infiltration of liver 07/31/2015  . HLD (hyperlipidemia) 07/31/2015  . Obstructive apnea 07/31/2015  . Malignant neoplasm of female breast ([place]) 11/18/2014  . Chest pain 06/29/2013  . 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 Allergic rhinitis 08/16/2012  . Dysfunctional voiding of urine 12/17/2011  . Urge incontinence of urine 09/15/2011  . Apnea, sleep 06/25/2011  . Benign essential HTN 02/17/2011  . 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 No pertinent surgical history.  Prior to Admission medications   Medication Sig Start Date End Date Taking? Authorizing Provider  albuterol (PROAIR HFA) 108 (90 BASE) MCG/ACT inhaler Inhale 2 puffs into the lungs every 4 (four) hours as needed.  09/14/13  Yes [provider]  Armodafinil 250 MG tablet Take 250 mg by mouth 2 (two) times daily  as needed.  06/24/15  Yes [provider]  cetirizine (ZYRTEC) 10 MG tablet Take 10 mg by mouth 2 (two) times daily.    Yes [provider]  citalopram (CELEXA) 20 MG tablet Take 30 mg by mouth daily.  06/24/15  Yes [provider]  Docusate Sodium 100 MG capsule Take 100 mg by mouth 2 (two) times daily.    Yes [provider]  fluocinonide (LIDEX) 0.05 % external solution APPLY TO AFFECTED AREA TWICE DAILY AS NEEDED 04/16/14  Yes [provider]  fluticasone (FLONASE) 50 MCG/ACT nasal spray Place 1 spray into both nostrils daily.  06/06/15  Yes [provider]  ibuprofen (ADVIL,MOTRIN) 800 MG tablet TAKE 1 TABLET BY MOUTH EVERY 8 HOURS AS NEEDED FOR PAIN 10/08/14  Yes [provider]  lisinopril-hydrochlorothiazide (PRINZIDE,ZESTORETIC) 10-12.5 MG tablet Take 1 tablet by mouth daily.  06/24/15  Yes [provider]  Misc Natural Products [facility]) CAPS Take 1 capsule by mouth daily.  02/18/17  Yes [provider]  montelukast (SINGULAIR) 10 MG tablet TAKE 1 TABLET BY MOUTH AT BEDTIME 12/11/14  Yes [provider]  MYRBETRIQ 50 MG TB24 tablet 50 mg daily.  03/01/17  Yes [provider]  omalizumab Arvid Right) 150 MG injection Inject 150 mg into the skin every 28 (twenty-eight) days.    Yes [provider]  ranitidine (PX RANITIDINE) 75 MG tablet Take 150 mg by mouth 2 (two) times daily.    Yes [provider]  simvastatin (ZOCOR) 20 MG tablet Take 20 mg by mouth daily at  6 PM.  06/24/15  Yes [provider]  vitamin B-12 (CYANOCOBALAMIN) 1000 MCG tablet Take 1,000 mcg by mouth daily.  02/18/17 02/18/18 Yes [provider]  meloxicam (MOBIC) 15 MG tablet Take 1 tablet (15 mg total) by mouth daily. Patient not taking: Reported on 05/13/2017 01/20/16   Garrel Ridgel, DPM  oxyCODONE-acetaminophen (PERCOCET) 10-325 MG tablet Take 1 tablet by mouth every 6 (six) hours as needed  for pain. Patient not taking: Reported on 05/13/2017 02/04/17   Garrel Ridgel, DPM  promethazine (PHENERGAN) 25 MG tablet Take 1 tablet (25 mg total) by mouth every 8 (eight) hours as needed. Patient not taking: Reported on 05/13/2017 02/04/17   Garrel Ridgel, DPM    Allergies Other  History reviewed. No pertinent family history.  Social History Social History  Substance Use Topics  . Smoking status: Never Smoker  . Smokeless tobacco: Never Used  . Alcohol use No    Review of Systems Constitutional: No fever/chills Eyes: No visual changes. ENT: No sore throat. No stiff neck no neck pain Cardiovascular: see history of present illness regardingchest pain. Respiratory: Denies shortness of breath.positive cough Gastrointestinal:   no vomiting.  No diarrhea.  No constipation. Genitourinary: Negative for dysuria. Musculoskeletal: Negative lower extremity swelling Skin: Negative for rash. Neurological: Negative for severe headaches, focal weakness or numbness.   ____________________________________________   PHYSICAL EXAM:  VITAL SIGNS: ED Triage Vitals  Enc Vitals Group     BP 05/13/17 1545 118/65     Pulse Rate 05/13/17 1545 63     Resp 05/13/17 1545 19     Temp --      Temp src --      SpO2 05/13/17 1545 92 %     Weight --      Height --      Head Circumference --      Peak Flow --      Pain Score 05/13/17 1441 8     Pain Loc --      Pain Edu? --      Excl. in [place]? --     Constitutional: Alert and oriented. Well appearing and in no acute distress. Eyes: Conjunctivae are normal Head: Atraumatic HEENT: No congestion/rhinnorhea. Mucous membranes are moist.  Oropharynx non-erythematous Neck:   Nontender with no meningismus, no masses, no stridor Cardiovascular: Normal rate, regular rhythm. Grossly normal heart sounds.  Good peripheral circulation.chest is mildly tender to touch which seems to reproduce some of her discomfort. No flail chest or crepitus  noted. Respiratory: Normal respiratory effort.  No retractions. Lungs CTAB. Abdominal: Soft and nontender. No distention. No guarding no rebound Back:  There is no focal tenderness or step off.  there is no midline tenderness there are no lesions noted. there is no CVA tenderness Musculoskeletal: No lower extremity tenderness, no upper extremity tenderness. No joint effusions, no DVT signs strong distal pulses no edema Neurologic:  Normal speech and language. No gross focal neurologic deficits are appreciated.  Skin:  Skin is warm, dry and intact. mild hives noted on arms especially Psychiatric: Mood and affect are anxious. Speech and behavior are normal.  ____________________________________________   LABS (all labs ordered are listed, but only abnormal results are displayed)  Labs Reviewed  BASIC METABOLIC PANEL - Abnormal; Notable for the following:       Result Value   Potassium 3.3 (*)    Glucose, Bld 118 (*)    Calcium 8.3 (*)    All other components  within normal limits  CBC - Abnormal; Notable for the following:    WBC 13.8 (*)    RDW 15.0 (*)    All other components within normal limits  HEPATIC FUNCTION PANEL - Abnormal; Notable for the following:    Total Protein 5.6 (*)    Albumin 3.3 (*)    All other components within normal limits  FIBRIN DERIVATIVES D-DIMER ([facility] ONLY) - Abnormal; Notable for the following:    Fibrin derivatives D-dimer [facility]) 1,421.26 (*)    All other components within normal limits  TROPONIN I  LIPASE, BLOOD  TROPONIN I    Pertinent labs  results that were available during my care of the patient were reviewed by me and considered in my medical decision making (see chart for details). ____________________________________________  EKG  I personally interpreted any EKGs ordered by me or triage sinus recurrent refusing abuse from an acute ST elevation or acute ST depression normal axis, side from mild bradycardia unremarkable  EKG ____________________________________________  RADIOLOGY  Pertinent labs & imaging results that were available during my care of the patient were reviewed by me and considered in my medical decision making (see chart for details). If possible, patient and/or family made aware of any abnormal findings. ____________________________________________    PROCEDURES  Procedure(s) performed: None  Procedures  Critical Care performed: None  ____________________________________________   INITIAL IMPRESSION / ASSESSMENT AND PLAN / ED COURSE  Pertinent labs & imaging results that were available during my care of the patient were reviewed by me and considered in my medical decision making (see chart for details).  patient has pleuritic chest pain here, initial workup is reassuring however d-dimer is positive so we did a CT scan which is fortunately negative. Most likely this is muscular skeletal pain from her coughing. No evidence of pneumonia. We did give her steroids and she feels better after that and breathing treatment. No indication for antibiotics are determined. We'll send a second set of cardiac markers as a precaution if negative as it is better we'll discharge patient home. She is very comfortable, sleeping in the room and has no further complaints or distress. Return precautions and follow-up given and understood.At this time, there does not appear to be clinical evidence to support the diagnosis of pulmonary embolus, dissection, myocarditis, endocarditis, pericarditis, pericardial tamponade, acute coronary syndrome, pneumothorax, pneumonia, or any other acute intrathoracic pathology that will require admission or acute intervention. Nor is there evidence of any significant intra-abdominal pathology causing this discomfort.Patient is very comfortable with the discharge plan. Customary extensive return precautions and recommended follow-up explained to and understood by the patient.   Questions answered. If patient was administered any sedating medications here, they were advised not to drive home. If any sedating medications were prescribed, pt was instructed not to drink, drive, care for children, or operate machinery, or do anything which could imperil either them or others.  signed out to Dr. Kerman Passey at the end of my shift.no evidence of anaphylaxis or other significant allergic reaction, patient has chronic hives and there are some small hives noted today however this is been going on for several months,patient is requesting Benadryl which she takes an abraded the basis  Clinical Course as of May 14 2039  Thu May 13, 2017  2037 patient is asymptomatic at this time,  [JM]    Clinical Course User Index [JM] Schuyler Amor, MD   ____________________________________________   FINAL CLINICAL IMPRESSION(S) / ED DIAGNOSES  Final diagnoses:  None  This chart was dictated using voice recognition software.  Despite best efforts to proofread,  errors can occur which can change meaning.      Schuyler Amor, MD 05/13/17 2045    Schuyler Amor, MD 05/13/17 2056    Schuyler Amor, MD 05/13/17 2100

## 2017-05-13 NOTE — ED Notes (Signed)
PT and patients family have been updated on results and plan of care. Blood drawn and sent and pt updated on delay.

## 2017-05-13 NOTE — ED Triage Notes (Signed)
Patient complains of center of chest pain that started in the early morning and has been constant. Patient states that she has shortness of breath with this as well and hurts worse with deep breaths. Patient states that she recently started Xolair injections for her Chronic Urticaria. Patient states that last injection was last week. Patient states that the chest pain kept her up all night.

## 2017-06-10 NOTE — Progress Notes (Signed)
This encounter was created in error - please disregard.

## 2018-02-07 ENCOUNTER — Encounter: Payer: Self-pay | Admitting: Podiatry

## 2019-05-26 ENCOUNTER — Emergency Department: Payer: BC Managed Care – PPO

## 2019-05-26 ENCOUNTER — Emergency Department
Admission: EM | Admit: 2019-05-26 | Discharge: 2019-05-26 | Disposition: A | Discharge status: Home / Self Care | Payer: BC Managed Care – PPO | Attending: Emergency Medicine | Admitting: Emergency Medicine

## 2019-05-26 ENCOUNTER — Other Ambulatory Visit: Payer: Self-pay

## 2019-05-26 ENCOUNTER — Encounter: Payer: Self-pay | Admitting: Emergency Medicine

## 2019-05-26 DIAGNOSIS — M5489 Other dorsalgia: Secondary | ICD-10-CM | POA: Diagnosis present

## 2019-05-26 DIAGNOSIS — M5431 Sciatica, right side: Secondary | ICD-10-CM | POA: Diagnosis not present

## 2019-05-26 DIAGNOSIS — I10 Essential (primary) hypertension: Secondary | ICD-10-CM | POA: Insufficient documentation

## 2019-05-26 DIAGNOSIS — Z79899 Other long term (current) drug therapy: Secondary | ICD-10-CM | POA: Diagnosis not present

## 2019-05-26 MED ORDER — GABAPENTIN 100 MG PO CAPS: 100.0000 mg | ORAL_CAPSULE | Freq: Three times a day (TID) | ORAL | 0 refills | Status: DC | PRN

## 2019-05-26 MED ORDER — METHYLPREDNISOLONE SODIUM SUCC 125 MG IJ SOLR
125.0000 mg | Freq: Once | INTRAMUSCULAR | Status: AC
  Administered 2019-05-26: 04:00:00 125 mg via INTRAVENOUS
  Filled 2019-05-26: qty 2

## 2019-05-26 MED ORDER — MORPHINE SULFATE (PF) 4 MG/ML IV SOLN
4.0000 mg | Freq: Once | INTRAVENOUS | Status: AC
  Administered 2019-05-26: 4 mg via INTRAVENOUS
  Filled 2019-05-26: qty 1

## 2019-05-26 MED ORDER — KETOROLAC TROMETHAMINE 10 MG PO TABS: 10.0000 mg | ORAL_TABLET | Freq: Four times a day (QID) | ORAL | 0 refills | Status: DC | PRN

## 2019-05-26 MED ORDER — KETOROLAC TROMETHAMINE 30 MG/ML IJ SOLN
30.0000 mg | Freq: Once | INTRAMUSCULAR | Status: AC
  Administered 2019-05-26: 30 mg via INTRAVENOUS
  Filled 2019-05-26: qty 1

## 2019-05-26 MED ORDER — OXYCODONE-ACETAMINOPHEN 10-325 MG PO TABS: 1.0000 | ORAL_TABLET | Freq: Four times a day (QID) | ORAL | 0 refills | Status: AC | PRN

## 2019-05-26 NOTE — ED Provider Notes (Signed)
[facility] Emergency Department Provider Note    First MD Initiated Contact with Patient 05/26/19 0400     (approximate)  I have reviewed the triage vital signs and the nursing notes.   HISTORY  Chief Complaint Back Pain   HPI Courtney Glover is a 58 y.o. female with below list of previous medical conditions including known  lumbar radiculopathy status post lumbar fusion with indwelling nerve stimulator presents to the emergency department secondary to 10 out of 10 low back pain with radiation down the posterior right leg.  Patient states that she was seen at [facility] yesterday secondary to the same prescribed hydrocodone and prednisone taper.  Patient states however pain considerably worsened this morning.  Patient denies any lower extremity weakness or numbness.  No bowel or bladder habit incontinence..       Past Medical History:  Diagnosis Date  . Hypertension     Patient Active Problem List   Diagnosis Date Noted  . Fatty infiltration of liver 07/31/2015  . HLD (hyperlipidemia) 07/31/2015  . Obstructive apnea 07/31/2015  . Malignant neoplasm of female breast ([place]) 11/18/2014  . Chest pain 06/29/2013  . 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 Allergic rhinitis 08/16/2012  . Dysfunctional voiding of urine 12/17/2011  . Urge incontinence of urine 09/15/2011  . Apnea, sleep 06/25/2011  . Benign essential HTN 02/17/2011  . 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 No pertinent surgical history.  Prior to Admission medications   Medication Sig Start Date End Date Taking? Authorizing Provider  albuterol (PROAIR HFA) 108 (90 BASE) MCG/ACT inhaler Inhale 2 puffs into the lungs every 4 (four) hours as needed.  09/14/13   [provider]  Armodafinil 250 MG tablet Take 250 mg by mouth 2 (two) times daily as needed.  06/24/15   [provider]  cetirizine (ZYRTEC) 10 MG tablet Take 10 mg by mouth 2 (two) times daily.      [provider]  citalopram (CELEXA) 20 MG tablet Take 30 mg by mouth daily.  06/24/15   [provider]  Docusate Sodium 100 MG capsule Take 100 mg by mouth 2 (two) times daily.     [provider]  fluocinonide (LIDEX) 0.05 % external solution APPLY TO AFFECTED AREA TWICE DAILY AS NEEDED 04/16/14   [provider]  fluticasone (FLONASE) 50 MCG/ACT nasal spray Place 1 spray into both nostrils daily.  06/06/15   [provider]  gabapentin (NEURONTIN) 100 MG capsule Take 1 capsule (100 mg total) by mouth 3 (three) times daily as needed (low back pain). 05/26/19 06/25/19  Gregor Hams, MD  guaiFENesin-codeine 100-10 MG/5ML syrup Take 5 mLs by mouth every 6 (six) hours as needed for cough. 05/13/17   Harvest Dark, MD  ibuprofen (ADVIL,MOTRIN) 800 MG tablet TAKE 1 TABLET BY MOUTH EVERY 8 HOURS AS NEEDED FOR PAIN 10/08/14   [provider]  ketorolac (TORADOL) 10 MG tablet Take 1 tablet (10 mg total) by mouth every 6 (six) hours as needed. 05/26/19   Gregor Hams, MD  lisinopril-hydrochlorothiazide (PRINZIDE,ZESTORETIC) 10-12.5 MG tablet Take 1 tablet by mouth daily.  06/24/15   [provider]  meloxicam (MOBIC) 15 MG tablet Take 1 tablet (15 mg total) by mouth daily. Patient not taking: Reported on 05/13/2017 01/20/16   Tyson Dense T, DPM  Misc Natural Products [facility]) CAPS Take 1 capsule by mouth daily.  02/18/17   [provider]  montelukast (SINGULAIR) 10 MG tablet  TAKE 1 TABLET BY MOUTH AT BEDTIME 12/11/14   [provider]  MYRBETRIQ 50 MG TB24 tablet 50 mg daily.  03/01/17   [provider]  omalizumab Arvid Right) 150 MG injection Inject 150 mg into the skin every 28 (twenty-eight) days.     [provider]  oxyCODONE-acetaminophen (PERCOCET) 10-325 MG tablet Take 1 tablet by mouth every 6 (six) hours as needed for pain. Patient not taking: Reported on 05/13/2017 02/04/17   Garrel Ridgel, DPM  oxyCODONE-acetaminophen (PERCOCET) 10-325 MG tablet Take 1 tablet by mouth every 6 (six) hours as needed for pain. 05/26/19 05/25/20  Gregor Hams, MD  promethazine (PHENERGAN) 25 MG tablet Take 1 tablet (25 mg total) by mouth every 8 (eight) hours as needed. Patient not taking: Reported on 05/13/2017 02/04/17   Tyson Dense T, DPM  ranitidine (PX RANITIDINE) 75 MG tablet Take 150 mg by mouth 2 (two) times daily.     [provider]  simvastatin (ZOCOR) 20 MG tablet Take 20 mg by mouth daily at 6 PM.  06/24/15   [provider]    Allergies Other  No family history on file.  Social History Social History   Tobacco Use  . Smoking status: Never Smoker  . Smokeless tobacco: Never Used  Substance Use Topics  . Alcohol use: No    Alcohol/week: 0.0 standard drinks  . Drug use: No    Review of Systems Constitutional: No fever/chills Eyes: No visual changes. ENT: No sore throat. Cardiovascular: Denies chest pain. Respiratory: Denies shortness of breath. Gastrointestinal: No abdominal pain.  No nausea, no vomiting.  No diarrhea.  No constipation. Genitourinary: Negative for dysuria. Musculoskeletal: Negative for neck pain.  Negative for back pain. Integumentary: Negative for rash. Neurological: Negative for headaches, focal weakness or numbness.  ____________________________________________   PHYSICAL EXAM:  VITAL SIGNS: ED Triage Vitals  Enc Vitals Group     BP 05/26/19 0337 (!) 206/98     Pulse Rate 05/26/19 0337 88     Resp 05/26/19 0337 (!) 21     Temp 05/26/19 0337 (!) 97.5 F (36.4 C)     Temp Source 05/26/19 0337 Oral     SpO2 05/26/19 0337 97 %     Weight 05/26/19 0326 106.6 kg (235 lb)     Height 05/26/19 0326 1.753 m (5\' 9" )     Head Circumference --      Peak Flow --      Pain Score 05/26/19 0326 10     Pain Loc --      Pain Edu? --      Excl. in [place]? --     Constitutional: Alert and oriented.  Apparent discomfort Eyes:  Conjunctivae are normal.  Mouth/Throat: Mucous membranes are moist. Neck: No stridor.  No meningeal signs.   Cardiovascular: Normal rate, regular rhythm. Good peripheral circulation. Grossly normal heart sounds. Respiratory: Normal respiratory effort.  No retractions. Gastrointestinal: Soft and nontender. No distention.  Musculoskeletal: No lower extremity tenderness nor edema. No gross deformities of extremities. Neurologic:  Normal speech and language. No gross focal neurologic deficits are appreciated.  Skin:  Skin is warm, dry and intact. Psychiatric: Mood and affect are normal. Speech and behavior are normal.  _____________  RADIOLOGY I, [place], personally viewed and evaluated these images (plain radiographs) as part of my medical decision making, as well as reviewing the written report by the radiologist.  ED MD interpretation: No acute findings or visible impingement noted on  CT lumbar spine per radiologist.  Official radiology report(s): Ct Lumbar Spine Wo Contrast  Result Date: 05/26/2019 CLINICAL DATA:  Persistent back pain despite conservative therapy. Right leg pain EXAM: CT LUMBAR SPINE WITHOUT CONTRAST TECHNIQUE: Multidetector CT imaging of the lumbar spine was performed without intravenous contrast administration. Multiplanar CT image reconstructions were also generated. COMPARISON:  None. FINDINGS: Segmentation: 5 lumbar type vertebrae Alignment: Slight retrolisthesis at L3-4 Vertebrae: No acute fracture or focal pathologic process. L4-S1 fusion with solid arthrodesis. Paraspinal and other soft tissues: No evidence of inflammation or mass. Atherosclerotic calcification of the aorta. Dorsal column stimulator which enters the dorsal canal at L1-2. No unexpected finding along the leads. Disc levels: Mild disc narrowing throughout the unfused levels, with mild retrolisthesis at L3-4. Mild disc bulging. IMPRESSION: 1. No acute finding or visible impingement. 2. Solid  arthrodesis from L4-S1. Electronically Signed   By: Monte Fantasia M.D.   On: 05/26/2019 05:18    ____________________________________________   PROCEDURES   Procedure(s) performed (including Critical Care):  Procedures   ____________________________________________   INITIAL IMPRESSION / MDM / [place] / ED COURSE  As part of my medical decision making, I reviewed the following data within the electronic MEDICAL RECORD NUMBER   58 year old female presented with above-stated history and physical exam consistent with sciatica on the right.  CT lumbar spine performed revealed mild disc bulge however no other acute findings or evidence of impingement or central canal stenosis.  Patient given IV morphine 4 mg x 2 doses and subsequently Toradol 30 mg IV as well as Solu-Medrol 125 mg with pain resolution at this time.  Patient will be referred to Dr. Cari Caraway neurosurgeon for further outpatient evaluation and management       ____________________________________________  FINAL CLINICAL IMPRESSION(S) / ED DIAGNOSES  Final diagnoses:  Sciatica of right side     MEDICATIONS GIVEN DURING THIS VISIT:  Medications  morphine 4 MG/ML injection 4 mg (4 mg Intravenous Given 05/26/19 0355)  methylPREDNISolone sodium succinate (SOLU-MEDROL) 125 mg/2 mL injection 125 mg (125 mg Intravenous Given 05/26/19 0358)  morphine 4 MG/ML injection 4 mg (4 mg Intravenous Given 05/26/19 0502)  ketorolac (TORADOL) 30 MG/ML injection 30 mg (30 mg Intravenous Given 05/26/19 0558)     ED Discharge Orders         Ordered    gabapentin (NEURONTIN) 100 MG capsule  3 times daily PRN     05/26/19 0603    oxyCODONE-acetaminophen (PERCOCET) 10-325 MG tablet  Every 6 hours PRN     05/26/19 0603    ketorolac (TORADOL) 10 MG tablet  Every 6 hours PRN     05/26/19 [id number]          *Please note:  Rilyn Ostenson was evaluated in Emergency Department on 05/26/2019 for the symptoms described in the history  of present illness. She was evaluated in the context of the global COVID-19 pandemic, which necessitated consideration that the patient might be at risk for infection with the SARS-CoV-2 virus that causes COVID-19. Institutional protocols and algorithms that pertain to the evaluation of patients at risk for COVID-19 are in a state of rapid change based on information released by regulatory bodies including the CDC and federal and state organizations. These policies and algorithms were followed during the patient's care in the ED.  Some ED evaluations and interventions may be delayed as a result of limited staffing during the pandemic.*  Note:  This document was prepared using Systems analyst and  may include unintentional dictation errors.   Gregor Hams, MD 05/26/19 [phone]

## 2019-05-26 NOTE — ED Triage Notes (Signed)
Pt assisted out of vehicle into w/c, appears very uncomfortable, moaning; reports hx spinal fusion and has spinal cord stimulator in place; rx prednisone and hydrocodne yesterday and [facility] for lower back pain radiating down rt leg; st persistent, intermittent severe pain

## 2019-05-26 NOTE — ED Notes (Signed)
Pt returned from CT with 10/10 pain. Dr Owens Shark notified who gave verbal orders for an additional 4mg  of morphine.

## 2019-06-23 ENCOUNTER — Emergency Department: Payer: BC Managed Care – PPO

## 2019-06-23 ENCOUNTER — Emergency Department
Admission: EM | Admit: 2019-06-23 | Discharge: 2019-06-23 | Disposition: A | Discharge status: Home / Self Care | Payer: BC Managed Care – PPO | Attending: Emergency Medicine | Admitting: Emergency Medicine

## 2019-06-23 ENCOUNTER — Encounter: Payer: Self-pay | Admitting: Emergency Medicine

## 2019-06-23 ENCOUNTER — Other Ambulatory Visit: Payer: Self-pay

## 2019-06-23 DIAGNOSIS — I10 Essential (primary) hypertension: Secondary | ICD-10-CM | POA: Insufficient documentation

## 2019-06-23 DIAGNOSIS — Z79899 Other long term (current) drug therapy: Secondary | ICD-10-CM | POA: Insufficient documentation

## 2019-06-23 DIAGNOSIS — K8500 Idiopathic acute pancreatitis without necrosis or infection: Secondary | ICD-10-CM

## 2019-06-23 DIAGNOSIS — R1011 Right upper quadrant pain: Secondary | ICD-10-CM | POA: Diagnosis present

## 2019-06-23 LAB — COMPREHENSIVE METABOLIC PANEL
ALT: 29 U/L (ref 0–44)
AST: 28 U/L (ref 15–41)
Albumin: 4.4 g/dL (ref 3.5–5.0)
Alkaline Phosphatase: 68 U/L (ref 38–126)
Anion gap: 10 (ref 5–15)
BUN: 17 mg/dL (ref 6–20)
CO2: 25 mmol/L (ref 22–32)
Calcium: 9 mg/dL (ref 8.9–10.3)
Chloride: 103 mmol/L (ref 98–111)
Creatinine, Ser: 0.64 mg/dL (ref 0.44–1.00)
GFR calc Af Amer: >60 mL/min (ref >60)
GFR calc non Af Amer: >60 mL/min (ref >60)
Glucose, Bld: 110 mg/dL — ABNORMAL HIGH (ref 70–99)
Potassium: 4 mmol/L (ref 3.5–5.1)
Sodium: 138 mmol/L (ref 135–145)
Total Bilirubin: 0.9 mg/dL (ref 0.3–1.2)
Total Protein: 7.3 g/dL (ref 6.5–8.1)

## 2019-06-23 LAB — CBC
HCT: 37.7 % (ref 36.0–46.0)
Hemoglobin: 12.8 g/dL (ref 12.0–15.0)
MCH: 29.2 pg (ref 26.0–34.0)
MCHC: 34 g/dL (ref 30.0–36.0)
MCV: 86.1 fL (ref 80.0–100.0)
Platelets: 185 10*3/uL (ref 150–400)
RBC: 4.38 MIL/uL (ref 3.87–5.11)
RDW: 13.7 % (ref 11.5–15.5)
WBC: 13.9 10*3/uL — ABNORMAL HIGH (ref 4.0–10.5)
nRBC: 0 % (ref 0.0–0.2)

## 2019-06-23 LAB — LIPASE, BLOOD: Lipase: 112 U/L — ABNORMAL HIGH (ref 11–51)

## 2019-06-23 MED ORDER — MORPHINE SULFATE (PF) 4 MG/ML IV SOLN
INTRAVENOUS | Status: AC
  Administered 2019-06-23: 4 mg via INTRAVENOUS
  Filled 2019-06-23: qty 1

## 2019-06-23 MED ORDER — MORPHINE SULFATE (PF) 4 MG/ML IV SOLN
4.0000 mg | Freq: Once | INTRAVENOUS | Status: AC
  Administered 2019-06-23: 06:00:00 4 mg via INTRAVENOUS

## 2019-06-23 MED ORDER — ONDANSETRON HCL 4 MG/2ML IJ SOLN
INTRAMUSCULAR | Status: AC
  Administered 2019-06-23: 4 mg via INTRAVENOUS
  Filled 2019-06-23: qty 2

## 2019-06-23 MED ORDER — MORPHINE SULFATE (PF) 4 MG/ML IV SOLN
4.0000 mg | Freq: Once | INTRAVENOUS | Status: AC
  Administered 2019-06-23: 4 mg via INTRAVENOUS
  Filled 2019-06-23: qty 1

## 2019-06-23 MED ORDER — FAMOTIDINE 20 MG PO TABS: 20.0000 mg | ORAL_TABLET | Freq: Two times a day (BID) | ORAL | 1 refills | Status: DC

## 2019-06-23 MED ORDER — HYDROMORPHONE HCL 1 MG/ML IJ SOLN
1.0000 mg | Freq: Once | INTRAMUSCULAR | Status: AC
  Administered 2019-06-23: 11:00:00 1 mg via INTRAVENOUS
  Filled 2019-06-23: qty 1

## 2019-06-23 MED ORDER — ONDANSETRON HCL 4 MG/2ML IJ SOLN
4.0000 mg | Freq: Once | INTRAMUSCULAR | Status: AC
  Administered 2019-06-23: 07:00:00 4 mg via INTRAVENOUS
  Filled 2019-06-23: qty 2

## 2019-06-23 MED ORDER — ONDANSETRON HCL 4 MG/2ML IJ SOLN
4.0000 mg | Freq: Once | INTRAMUSCULAR | Status: AC
  Administered 2019-06-23: 06:00:00 4 mg via INTRAVENOUS

## 2019-06-23 MED ORDER — KETOROLAC TROMETHAMINE 30 MG/ML IJ SOLN
30.0000 mg | Freq: Once | INTRAMUSCULAR | Status: AC
  Administered 2019-06-23: 30 mg via INTRAVENOUS
  Filled 2019-06-23: qty 1

## 2019-06-23 MED ORDER — KETOROLAC TROMETHAMINE 10 MG PO TABS: 10.0000 mg | ORAL_TABLET | Freq: Four times a day (QID) | ORAL | 0 refills | Status: DC | PRN

## 2019-06-23 NOTE — ED Provider Notes (Signed)
[facility] Emergency Department Provider Note   First MD Initiated Contact with Patient 06/23/19 [phone]     (approximate)  I have reviewed the triage vital signs and the nursing notes.   HISTORY  Chief Complaint Abdominal Pain   HPI Courtney Glover is a 58 y.o. female with below list of previous medical conditions presents emergency department secondary to upper abdominal discomfort which patient stated began at approximately 9:30 PM.  Patient states that the pain was initially on the left and then subsequently has moved to the right side of her abdomen.  Patient denies any nausea or vomiting no diarrhea constipation.  Patient denies any fever.  Patient denies any urinary symptoms.  Patient states that her current pain score is 10 out of 10.  Patient states that the pain refers to her back and describes it as "back spasms".        Past Medical History:  Diagnosis Date  . Hypertension     Patient Active Problem List   Diagnosis Date Noted  . Fatty infiltration of liver 07/31/2015  . HLD (hyperlipidemia) 07/31/2015  . Obstructive apnea 07/31/2015  . Malignant neoplasm of female breast ([place]) 11/18/2014  . Chest pain 06/29/2013  . 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 Allergic rhinitis 08/16/2012  . Dysfunctional voiding of urine 12/17/2011  . Urge incontinence of urine 09/15/2011  . Apnea, sleep 06/25/2011  . Benign essential HTN 02/17/2011  . 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 No pertinent surgical history.  Prior to Admission medications   Medication Sig Start Date End Date Taking? Authorizing Provider  albuterol (PROAIR HFA) 108 (90 BASE) MCG/ACT inhaler Inhale 2 puffs into the lungs every 4 (four) hours as needed.  09/14/13   [provider]  Armodafinil 250 MG tablet Take 250 mg by mouth 2 (two) times daily as needed.  06/24/15   [provider]  cetirizine (ZYRTEC) 10 MG tablet Take 10 mg by mouth 2 (two) times  daily.     [provider]  citalopram (CELEXA) 20 MG tablet Take 30 mg by mouth daily.  06/24/15   [provider]  Docusate Sodium 100 MG capsule Take 100 mg by mouth 2 (two) times daily.     [provider]  fluocinonide (LIDEX) 0.05 % external solution APPLY TO AFFECTED AREA TWICE DAILY AS NEEDED 04/16/14   [provider]  fluticasone (FLONASE) 50 MCG/ACT nasal spray Place 1 spray into both nostrils daily.  06/06/15   [provider]  gabapentin (NEURONTIN) 100 MG capsule Take 1 capsule (100 mg total) by mouth 3 (three) times daily as needed (low back pain). 05/26/19 06/25/19  Gregor Hams, MD  guaiFENesin-codeine 100-10 MG/5ML syrup Take 5 mLs by mouth every 6 (six) hours as needed for cough. 05/13/17   Harvest Dark, MD  ibuprofen (ADVIL,MOTRIN) 800 MG tablet TAKE 1 TABLET BY MOUTH EVERY 8 HOURS AS NEEDED FOR PAIN 10/08/14   [provider]  ketorolac (TORADOL) 10 MG tablet Take 1 tablet (10 mg total) by mouth every 6 (six) hours as needed. 05/26/19   Gregor Hams, MD  lisinopril-hydrochlorothiazide (PRINZIDE,ZESTORETIC) 10-12.5 MG tablet Take 1 tablet by mouth daily.  06/24/15   [provider]  meloxicam (MOBIC) 15 MG tablet Take 1 tablet (15 mg total) by mouth daily. Patient not taking: Reported on 05/13/2017 01/20/16   Tyson Dense T, DPM  Misc Natural Products [facility]) CAPS Take 1 capsule by mouth daily.  02/18/17  [provider]  montelukast (SINGULAIR) 10 MG tablet TAKE 1 TABLET BY MOUTH AT BEDTIME 12/11/14   [provider]  MYRBETRIQ 50 MG TB24 tablet 50 mg daily.  03/01/17   [provider]  omalizumab Arvid Right) 150 MG injection Inject 150 mg into the skin every 28 (twenty-eight) days.     [provider]  oxyCODONE-acetaminophen (PERCOCET) 10-325 MG tablet Take 1 tablet by mouth every 6 (six) hours as needed for pain. Patient not taking: Reported on 05/13/2017 02/04/17    Garrel Ridgel, DPM  oxyCODONE-acetaminophen (PERCOCET) 10-325 MG tablet Take 1 tablet by mouth every 6 (six) hours as needed for pain. 05/26/19 05/25/20  Gregor Hams, MD  promethazine (PHENERGAN) 25 MG tablet Take 1 tablet (25 mg total) by mouth every 8 (eight) hours as needed. Patient not taking: Reported on 05/13/2017 02/04/17   Tyson Dense T, DPM  ranitidine (PX RANITIDINE) 75 MG tablet Take 150 mg by mouth 2 (two) times daily.     [provider]  simvastatin (ZOCOR) 20 MG tablet Take 20 mg by mouth daily at 6 PM.  06/24/15   [provider]    Allergies Other  No family history on file.  Social History Social History   Tobacco Use  . Smoking status: Never Smoker  . Smokeless tobacco: Never Used  Substance Use Topics  . Alcohol use: No    Alcohol/week: 0.0 standard drinks  . Drug use: No    Review of Systems Constitutional: No fever/chills Eyes: No visual changes. ENT: No sore throat. Cardiovascular: Denies chest pain. Respiratory: Denies shortness of breath. Gastrointestinal: Positive for abdominal pain.  No nausea, no vomiting.  No diarrhea.  No constipation. Genitourinary: Negative for dysuria. Musculoskeletal: Negative for neck pain.  Negative for back pain. Integumentary: Negative for rash. Neurological: Negative for headaches, focal weakness or numbness.  ____________________________________________   PHYSICAL EXAM:  VITAL SIGNS: ED Triage Vitals [06/23/19 0534]  Enc Vitals Group     BP (!) 167/100     Pulse Rate 66     Resp (!) 22     Temp (!) 97.5 F (36.4 C)     Temp Source Oral     SpO2 97 %     Weight 104.3 kg (230 lb)     Height 1.753 m (5\' 9" )     Head Circumference      Peak Flow      Pain Score 10     Pain Loc      Pain Edu?      Excl. in [place]?     Constitutional: Alert and oriented.  Eyes: Conjunctivae are normal.  Mouth/Throat: Patient is wearing a mask. Neck: No stridor.  No meningeal signs.   Cardiovascular:  Normal rate, regular rhythm. Good peripheral circulation. Grossly normal heart sounds. Respiratory: Normal respiratory effort.  No retractions. Gastrointestinal: Right upper quadrant/epigastric tenderness to palpation.  No distention.   Musculoskeletal: No lower extremity tenderness nor edema. No gross deformities of extremities. Neurologic:  Normal speech and language. No gross focal neurologic deficits are appreciated.  Skin:  Skin is warm, dry and intact. Psychiatric: Mood and affect are normal. Speech and behavior are normal.  ____________________________________________   LABS (all labs ordered are listed, but only abnormal results are displayed)  Labs Reviewed  CBC - Abnormal; Notable for the following components:      Result Value   WBC 13.9 (*)    All other components within normal limits  COMPREHENSIVE METABOLIC  PANEL  LIPASE, BLOOD  URINALYSIS, COMPLETE (UACMP) WITH MICROSCOPIC     RADIOLOGY I, [place], personally viewed and evaluated these images (plain radiographs) as part of my medical decision making, as well as reviewing the written report by the radiologist.  ED MD interpretation: Status post cholecystectomy hepatic steatosis on right upper quadrant ultrasound.  Official radiology report(s): US Abdomen Limited Ruq  Result Date: 06/23/2019 CLINICAL DATA:  Pain EXAM: ULTRASOUND ABDOMEN LIMITED RIGHT UPPER QUADRANT COMPARISON:  None. FINDINGS: Gallbladder: Surgically absent Common bile duct: Diameter: 7 mm Liver: Diffuse increased echogenicity with slightly heterogeneous liver. Appearance typically secondary to fatty infiltration. Fibrosis secondary consideration. No secondary findings of cirrhosis noted. No focal hepatic lesion or intrahepatic biliary duct dilatation. Portal vein is patent on color Doppler imaging with normal direction of blood flow towards the liver. Other: None. IMPRESSION: 1. Status post cholecystectomy. 2. Hepatic steatosis. Electronically  Signed   By: Constance Holster M.D.   On: 06/23/2019 06:22      Procedures   ____________________________________________   INITIAL IMPRESSION / MDM / [place] / ED COURSE  As part of my medical decision making, I reviewed the following data within the electronic MEDICAL RECORD NUMBER  57 year old female presented with above-stated history and physical exam secondary to abdominal pain.  Concern for possible pancreatitis less likely diverticulitis less likely choledocholithiasis ultrasounds performed which revealed hepatic steatosis.  Laboratory data notable for a lipase of 112.  Patient was given IV morphine and Zofran in the emergency department. patient's care transferred to Dr. Jimmye Norman. ____________________________________________  FINAL CLINICAL IMPRESSION(S) / ED DIAGNOSES  Final diagnoses:  RUQ pain  Idiopathic acute pancreatitis without infection or necrosis     MEDICATIONS GIVEN DURING THIS VISIT:  Medications  morphine 4 MG/ML injection 4 mg (4 mg Intravenous Given 06/23/19 0549)  ondansetron (ZOFRAN) injection 4 mg (4 mg Intravenous Given 06/23/19 0549)     ED Discharge Orders    None      *Please note:  Courtney Glover was evaluated in Emergency Department on 06/23/2019 for the symptoms described in the history of present illness. She was evaluated in the context of the global COVID-19 pandemic, which necessitated consideration that the patient might be at risk for infection with the SARS-CoV-2 virus that causes COVID-19. Institutional protocols and algorithms that pertain to the evaluation of patients at risk for COVID-19 are in a state of rapid change based on information released by regulatory bodies including the CDC and federal and state organizations. These policies and algorithms were followed during the patient's care in the ED.  Some ED evaluations and interventions may be delayed as a result of limited staffing during the pandemic.*  Note:  This  document was prepared using Dragon voice recognition software and may include unintentional dictation errors.   Gregor Hams, MD 06/26/19 1416

## 2019-06-23 NOTE — ED Provider Notes (Signed)
Pain under control at this time. UA pending    Earleen Newport, MD 06/23/19 [phone]

## 2019-06-23 NOTE — ED Notes (Signed)
Pt got dressed, vitals are complete, and IV from left ac has been removed, walking pt to the exit.

## 2019-06-23 NOTE — ED Triage Notes (Signed)
Pt to triage via w/c, mask in place, appears uncomfortable, grimacing, moaning; st increasing rt sided abd pain radiating into back since yesterday with no accomp symptoms; denies hx of same

## 2020-11-08 ENCOUNTER — Ambulatory Visit
Admission: RE | Admit: 2020-11-08 | Discharge: 2020-11-08 | Disposition: A | Discharge status: Home / Self Care | Payer: BC Managed Care – PPO | Source: Ambulatory Visit | Attending: Family Medicine | Admitting: Family Medicine

## 2020-11-08 ENCOUNTER — Other Ambulatory Visit: Payer: Self-pay

## 2020-11-08 VITALS — BP 157/71 | HR 58 | Temp 97.8°F | Resp 18 | Ht 69.0 in | Wt 222.0 lb

## 2020-11-08 DIAGNOSIS — R300 Dysuria: Secondary | ICD-10-CM

## 2020-11-08 DIAGNOSIS — N390 Urinary tract infection, site not specified: Secondary | ICD-10-CM | POA: Diagnosis not present

## 2020-11-08 LAB — URINALYSIS, COMPLETE (UACMP) WITH MICROSCOPIC
Bilirubin Urine: NEGATIVE
Glucose, UA: NEGATIVE mg/dL
Ketones, ur: NEGATIVE mg/dL
Nitrite: NEGATIVE
Protein, ur: 100 mg/dL — AB
RBC / HPF: >50 RBC/hpf (ref 0–5)
Specific Gravity, Urine: 1.025 (ref 1.005–1.030)
WBC, UA: >50 WBC/hpf (ref 0–5)
pH: 6 (ref 5.0–8.0)

## 2020-11-08 MED ORDER — SULFAMETHOXAZOLE-TRIMETHOPRIM 800-160 MG PO TABS: 1.0000 | ORAL_TABLET | Freq: Two times a day (BID) | ORAL | 0 refills | Status: AC

## 2020-11-08 MED ORDER — PHENAZOPYRIDINE HCL 200 MG PO TABS: 200.0000 mg | ORAL_TABLET | Freq: Three times a day (TID) | ORAL | 0 refills | Status: AC

## 2020-11-08 NOTE — ED Triage Notes (Signed)
Pt c/o urinary pressure, pain and burning increasing since yesterday. Pt also reports spasms. Pt denies f/n/v/d or other symptoms.

## 2020-11-08 NOTE — Discharge Instructions (Addendum)
Take the Bactrim twice daily with food and a full glass of water for 5 days for treatment of your urinary tract infection.  Use the Pyridium every 8 hours as needed for urinary discomfort.  Increase your oral fluid intake so that you increase your urine production and flush your urinary system.  Follow-up with urology to discuss treatment options to prevent further UTIs.

## 2020-11-08 NOTE — ED Provider Notes (Signed)
[facility]    CSN: [id number] Arrival date & time: 11/08/20  1159      History   Chief Complaint Chief Complaint  Patient presents with  . Dysuria    HPI Courtney Glover is a 60 y.o. female.   HPI   60 year old female here for evaluation of urinary complaints.  Patient reports that she has been experiencing bladder pressure, pain and burning with urination, blood in her urine, and cloudy urine since yesterday.  Patient states that she gets a UTI about every 2 months and she has been evaluated by urology.  She messaged urology today but has not heard back so she came in for evaluation.  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  Patient denies fever, nausea, vomiting, or back pain.  Past Medical History:  Diagnosis Date  . Hypertension     Patient Active Problem List   Diagnosis Date Noted  . Fatty infiltration of liver 07/31/2015  . HLD (hyperlipidemia) 07/31/2015  . Obstructive apnea 07/31/2015  . Malignant neoplasm of female breast ([place]) 11/18/2014  . Chest pain 06/29/2013  . 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 Allergic rhinitis 08/16/2012  . Dysfunctional voiding of urine 12/17/2011  . Urge incontinence of urine 09/15/2011  . Apnea, sleep 06/25/2011  . Benign essential HTN 02/17/2011  . 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 No pertinent surgical history.  OB History   No obstetric history on file.      Home Medications    Prior to Admission medications   Medication Sig Start Date End Date Taking? Authorizing Provider  albuterol (VENTOLIN HFA) 108 (90 Base) MCG/ACT inhaler Inhale 2 puffs into the lungs every 4 (four) hours as needed.  09/14/13  Yes [provider]  Armodafinil 250 MG tablet Take 250 mg by mouth 2 (two) times daily as needed.  06/24/15  Yes [provider]  AUVI-Q 0.3 MG/0.3ML SOAJ injection Inject 1 pen injector  intramuscularly as directed  Inject into lateral thigh for anaphylaxis 09/11/20  Yes [provider]  buPROPion (WELLBUTRIN XL) 150 MG 24 hr tablet Take 1 tablet by mouth every morning. 11/22/17 06/17/21 Yes [provider]  carvedilol (COREG) 6.25 MG tablet Take 6.25 mg by mouth 2 (two) times daily. 10/28/20  Yes [provider]  Docusate Sodium 100 MG capsule Take 100 mg by mouth 2 (two) times daily.    Yes [provider]  esomeprazole (NEXIUM) 40 MG capsule Take 40 mg by mouth every morning. 10/29/20  Yes [provider]  fluocinonide (LIDEX) 0.05 % external solution APPLY TO AFFECTED AREA TWICE DAILY AS NEEDED 04/16/14  Yes [provider]  fluticasone (FLONASE) 50 MCG/ACT nasal spray Place 1 spray into both nostrils daily.  06/06/15  Yes [provider]  levocetirizine (XYZAL) 5 MG tablet TAKE 1-2 TABS BY MOUTH TWICE DAILY FOR URTICARIA 11/06/20  Yes [provider]  lisinopril (ZESTRIL) 40 MG tablet Take 40 mg by mouth daily. 10/28/20  Yes [provider]  montelukast (SINGULAIR) 10 MG tablet TAKE 1 TABLET BY MOUTH AT BEDTIME 12/11/14  Yes [provider]  omalizumab Arvid Right) 150 MG injection Inject 150 mg into the skin every 28 (twenty-eight) days.    Yes [provider]  phenazopyridine (PYRIDIUM) 200 MG tablet Take 1 tablet (200 mg total) by mouth 3 (three) times daily. 11/08/20  Yes Margarette Canada, NP  simvastatin (ZOCOR) 20 MG  tablet Take 20 mg by mouth daily at 6 PM.  06/24/15  Yes [provider]  sulfamethoxazole-trimethoprim (BACTRIM DS) 800-160 MG tablet Take 1 tablet by mouth 2 (two) times daily for 5 days. 11/08/20 11/13/20 Yes Margarette Canada, NP  tacrolimus (PROGRAF) 1 MG capsule Take 2 mg by mouth 2 (two) times daily. 10/03/20  Yes [provider]  Trospium Chloride 60 MG CP24 Take 1 capsule by mouth daily. 10/28/20  Yes [provider]  VANDAZOLE 0.75 % vaginal gel INSERT  INTO VAGINA ONCE DAILY FOR 5 DAYS 09/18/20  Yes [provider]  famotidine (PEPCID) 20 MG tablet Take 1 tablet (20 mg total) by mouth 2 (two) times daily. 06/23/19 11/08/20 Yes Earleen Newport, MD  ibuprofen (ADVIL,MOTRIN) 800 MG tablet TAKE 1 TABLET BY MOUTH EVERY 8 HOURS AS NEEDED FOR PAIN 10/08/14   [provider]  Misc Natural Products [facility]) CAPS Take 1 capsule by mouth daily.  02/18/17   [provider]  promethazine (PHENERGAN) 25 MG tablet Take 1 tablet (25 mg total) by mouth every 8 (eight) hours as needed. Patient not taking: Reported on 05/13/2017 02/04/17   Tyson Dense T, DPM  cetirizine (ZYRTEC) 10 MG tablet Take 10 mg by mouth 2 (two) times daily.   11/08/20  [provider]  citalopram (CELEXA) 20 MG tablet Take 30 mg by mouth daily.  06/24/15 11/08/20  [provider]  gabapentin (NEURONTIN) 100 MG capsule Take 1 capsule (100 mg total) by mouth 3 (three) times daily as needed (low back pain). 05/26/19 11/08/20  Gregor Hams, MD  lisinopril-hydrochlorothiazide (PRINZIDE,ZESTORETIC) 10-12.5 MG tablet Take 1 tablet by mouth daily.  06/24/15 11/08/20  [provider]  MYRBETRIQ 50 MG TB24 tablet 50 mg daily.  03/01/17 11/08/20  [provider]  ranitidine (ZANTAC) 75 MG tablet Take 150 mg by mouth 2 (two) times daily.   11/08/20  [provider]    Family History History reviewed. No pertinent family history.  Social History Social History   Tobacco Use  . Smoking status: Never Smoker  . Smokeless tobacco: Never Used  Vaping Use  . Vaping Use: Never used  Substance Use Topics  . Alcohol use: No    Alcohol/week: 0.0 standard drinks  . Drug use: No     Allergies   Other and Cephalexin   Review of Systems Review of Systems  Constitutional: Negative for fever.  Gastrointestinal: Negative for abdominal pain, nausea and vomiting.  Genitourinary: Positive for dysuria, frequency, hematuria  and urgency.  Musculoskeletal: Negative for back pain.     Physical Exam Triage Vital Signs ED Triage Vitals  Enc Vitals Group     BP --      Pulse --      Resp 11/08/20 1216 18     Temp --      Temp Source 11/08/20 1216 Oral     SpO2 --      Weight 11/08/20 1210 222 lb (100.7 kg)     Height 11/08/20 1210 5\' 9"  (1.753 m)     Head Circumference --      Peak Flow --      Pain Score 11/08/20 1209 8     Pain Loc --      Pain Edu? --      Excl. in [place]? --    No data found.  Updated Vital Signs BP (!) 157/71 (BP Location: Left Arm)   Pulse (!) 58   Temp 97.8  F (36.6 C) (Oral)   Resp 18   Ht 5\' 9"  (1.753 m)   Wt 222 lb (100.7 kg)   SpO2 99%   BMI 32.78 kg/m   Visual Acuity Right Eye Distance:   Left Eye Distance:   Bilateral Distance:    Right Eye Near:   Left Eye Near:    Bilateral Near:     Physical Exam Vitals and nursing note reviewed.  Constitutional:      General: She is not in acute distress.    Appearance: Normal appearance. She is not ill-appearing.  HENT:     Head: Normocephalic and atraumatic.  Cardiovascular:     Rate and Rhythm: Normal rate and regular rhythm.     Pulses: Normal pulses.     Heart sounds: Normal heart sounds. No murmur heard. No gallop.   Pulmonary:     Effort: Pulmonary effort is normal.     Breath sounds: Normal breath sounds. No wheezing, rhonchi or rales.  Abdominal:     Tenderness: There is no right CVA tenderness or left CVA tenderness.  Skin:    General: Skin is warm and dry.     Capillary Refill: Capillary refill takes less than 2 seconds.     Findings: No erythema or rash.  Neurological:     General: No focal deficit present.     Mental Status: She is alert and oriented to person, place, and time.  Psychiatric:        Mood and Affect: Mood normal.        Behavior: Behavior normal.        Thought Content: Thought content normal.        Judgment: Judgment normal.      UC Treatments / Results  Labs (all labs  ordered are listed, but only abnormal results are displayed) Labs Reviewed  URINALYSIS, COMPLETE (UACMP) WITH MICROSCOPIC - Abnormal; Notable for the following components:      Result Value   APPearance CLOUDY (*)    Hgb urine dipstick MODERATE (*)    Protein, ur 100 (*)    Leukocytes,Ua TRACE (*)    Bacteria, UA MANY (*)    All other components within normal limits  URINE CULTURE    EKG   Radiology No results found.  Procedures Procedures (including critical care time)  Medications Ordered in UC Medications - No data to display  Initial Impression / Assessment and Plan / UC Course  I have reviewed the triage vital signs and the nursing notes.  Pertinent labs & imaging results that were available during my care of the patient were reviewed by me and considered in my medical decision making (see chart for details).   Patient is a pleasant 60 year old female here for evaluation of painful urination with bladder pressure, hematuria, and cloudiness to the urine that started yesterday.  Patient has history of frequent UTIs and is followed by urology.  The current working [facility]uffer is that her tacrolimus may be suppressing her immune system and leading to her increased frequency of UTIs.  She is taking the tacrolimus for idiopathic urticaria.  Patient is nontoxic in appearance but does appear to be uncomfortable.  Physical exam reveals a soft abdomen without tenderness and no CVA tenderness.  Cardiopulmonary exam is unremarkable.  Will check urinalysis.  Urinalysis shows moderate hemoglobin, 100 protein, trace leukocytes, greater than 50 WBCs, greater than 50 RBCs, and many bacteria.  Will send UA for culture.  Patient most recently evaluated by [facility] urology  on 09/27/2020 for recurrent UTI and was prescribed Bactrim.  We will discharge patient home on Bactrim twice daily x5 days, Pyridium for urinary discomfort, have her increase her oral fluid intake, and follow-up with urology.   Final  Clinical Impressions(s) / UC Diagnoses   Final diagnoses:  Lower urinary tract infectious disease     Discharge Instructions     Take the Bactrim twice daily with food and a full glass of water for 5 days for treatment of your urinary tract infection.  Use the Pyridium every 8 hours as needed for urinary discomfort.  Increase your oral fluid intake so that you increase your urine production and flush your urinary system.  Follow-up with urology to discuss treatment options to prevent further UTIs.    ED Prescriptions    Medication Sig Dispense Auth. Provider   sulfamethoxazole-trimethoprim (BACTRIM DS) 800-160 MG tablet Take 1 tablet by mouth 2 (two) times daily for 5 days. 10 tablet Margarette Canada, NP   phenazopyridine (PYRIDIUM) 200 MG tablet Take 1 tablet (200 mg total) by mouth 3 (three) times daily. 6 tablet Margarette Canada, NP     PDMP not reviewed this encounter.   Margarette Canada, NP 11/08/20 1408

## 2020-11-11 LAB — URINE CULTURE
Culture: >=100000 — AB
Special Requests: NORMAL

## 2021-08-10 IMAGING — CT CT L SPINE W/O CM
3 of 4 series · 12 of 33 positions shown, 14 images · non-contrast
Comparison: None.

CLINICAL DATA: Persistent back pain despite conservative therapy.
Right leg pain

EXAM:
CT LUMBAR SPINE WITHOUT CONTRAST
TECHNIQUE: Multidetector CT imaging of the lumbar spine was performed without
intravenous contrast administration. Multiplanar CT image
reconstructions were also generated.

[Series 3: sagittal bone · sagittal · 0.35mm/px · 5 of 105 slices shown, 6 images]
[im 35/105  bone]
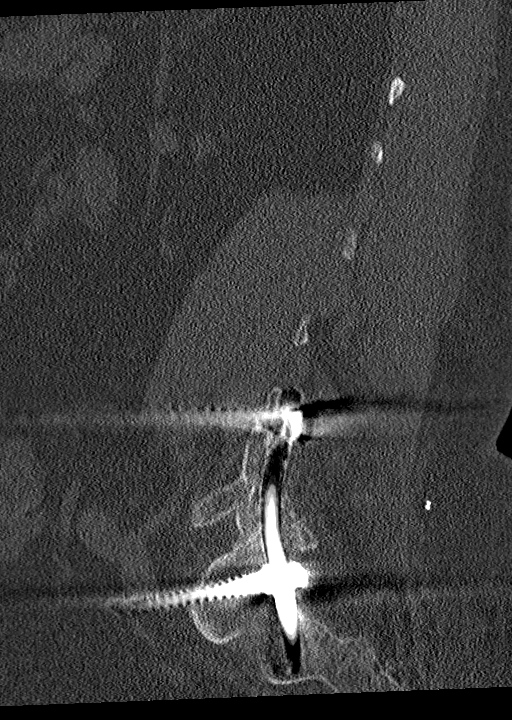
[im 44/105  bone]
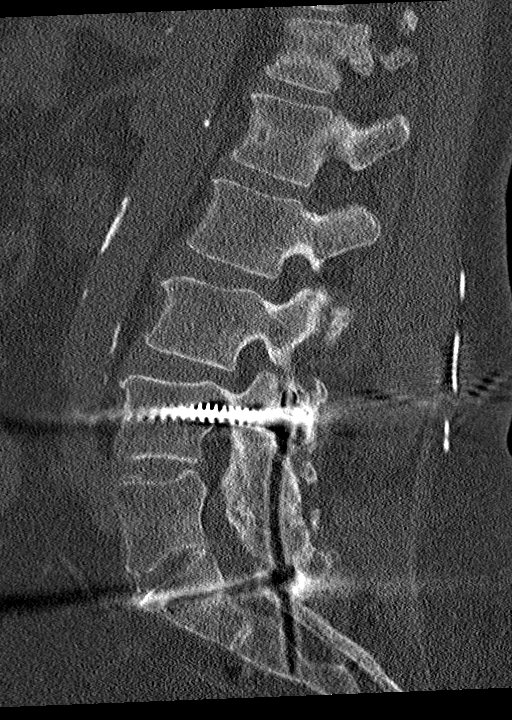
[im 53/105  soft-tissue]
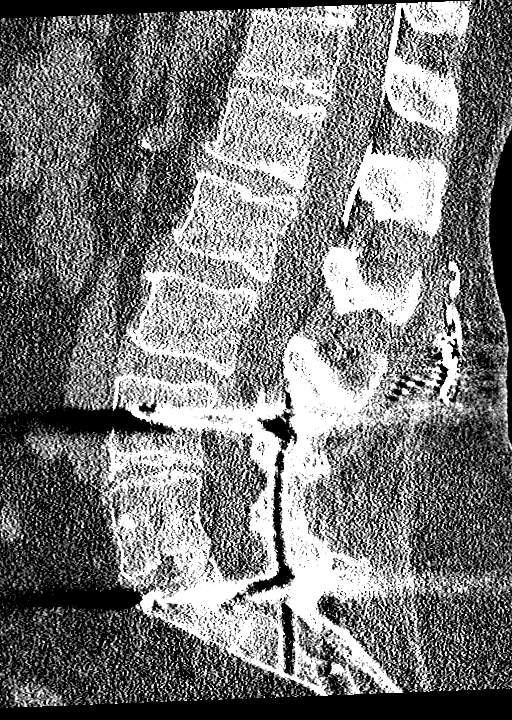
[im 53/105  bone]
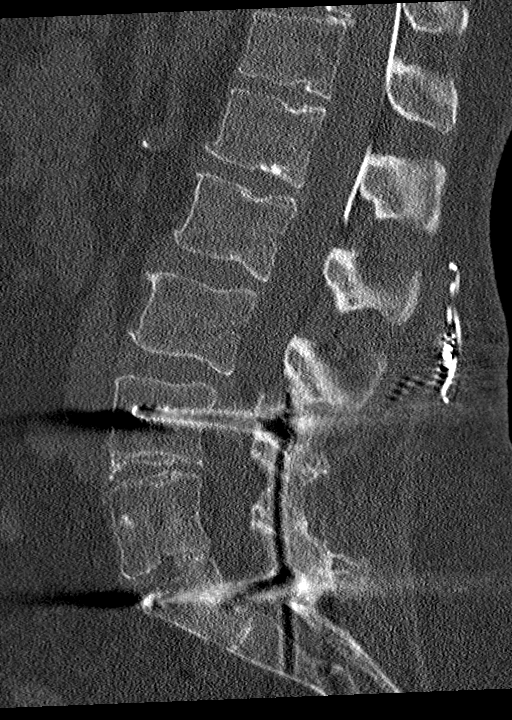
[im 61/105  bone]
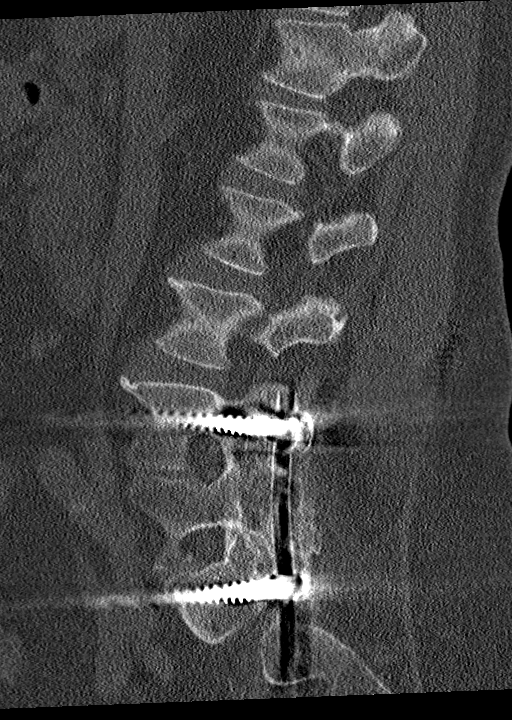
[im 70/105  bone]
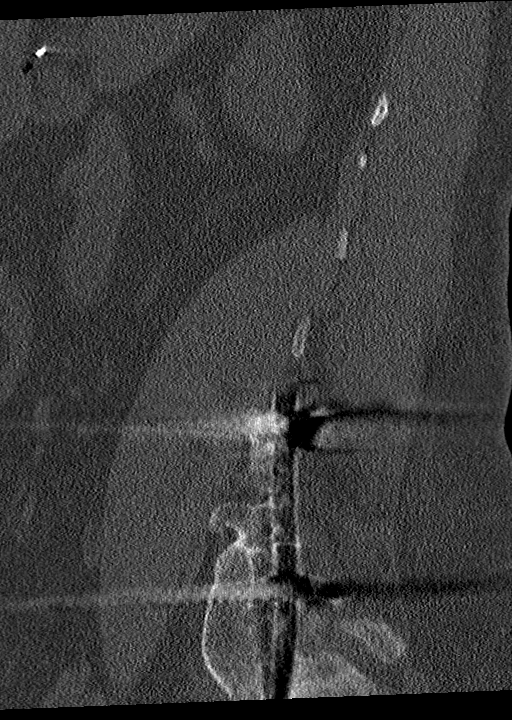

[Series 6: coronal bone · coronal · 0.29mm/px · 3 of 80 slices shown]
[im 16/80  bone]
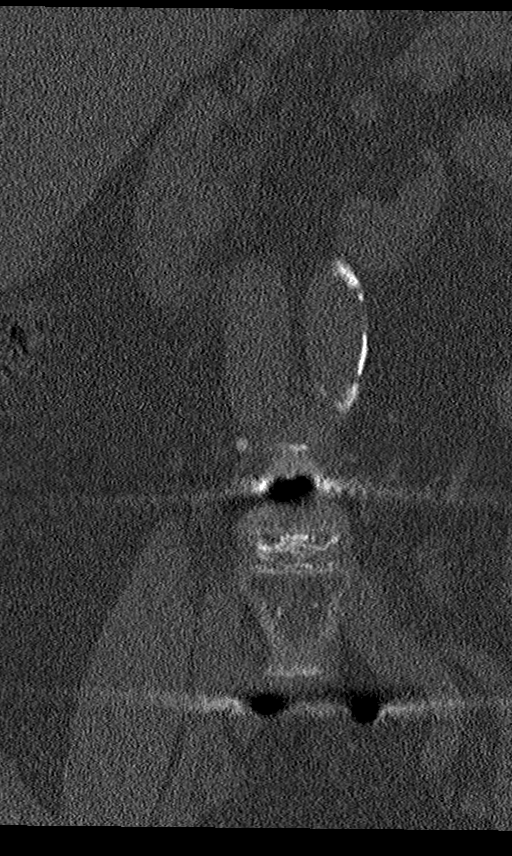
[im 32/80  bone]
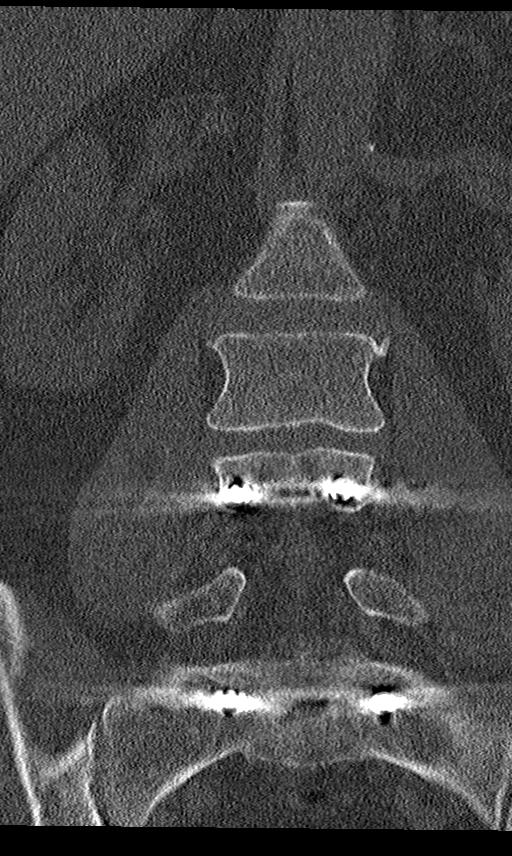
[im 48/80  bone]
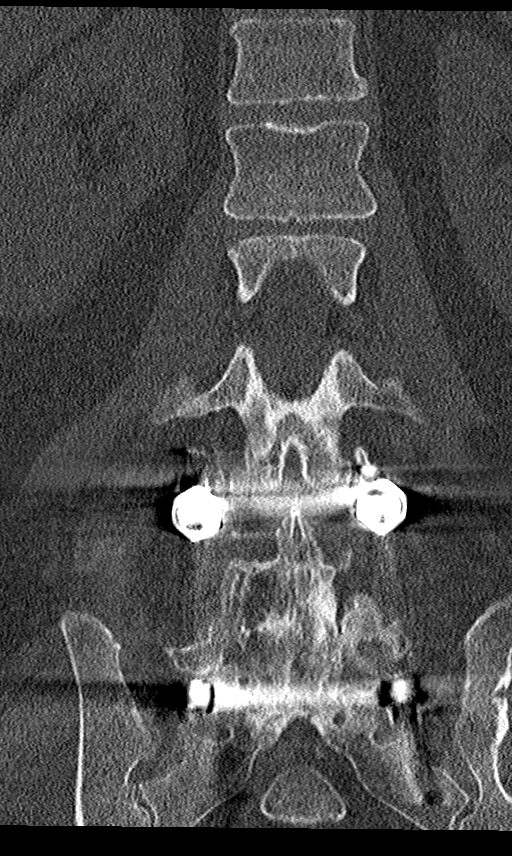

[Series 9: multi disc · axial · 0.21mm/px · z∈[-730,-555]mm · 4 of 117 slices shown, 5 images]
[im 20/117  soft-tissue]
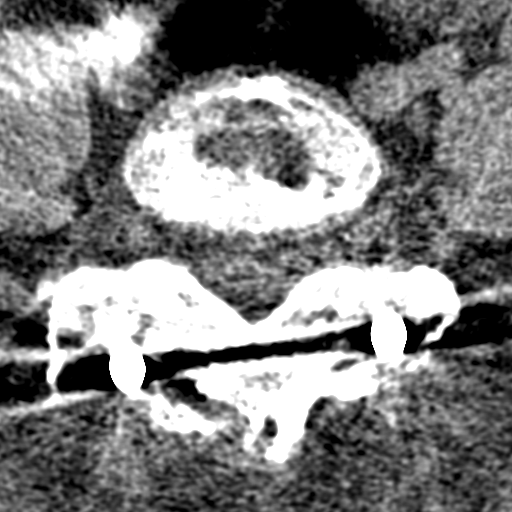
[im 20/117  bone]
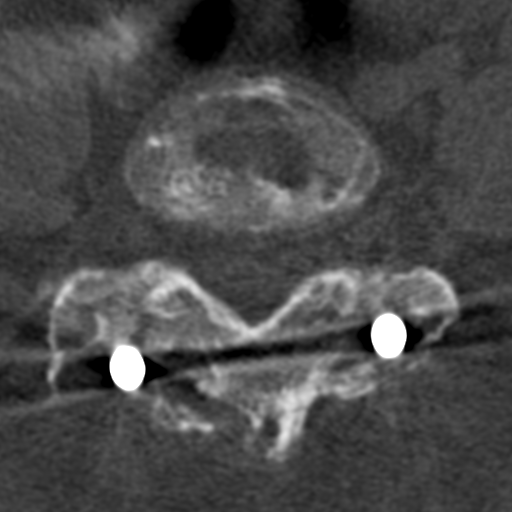
[im 39/117  bone]
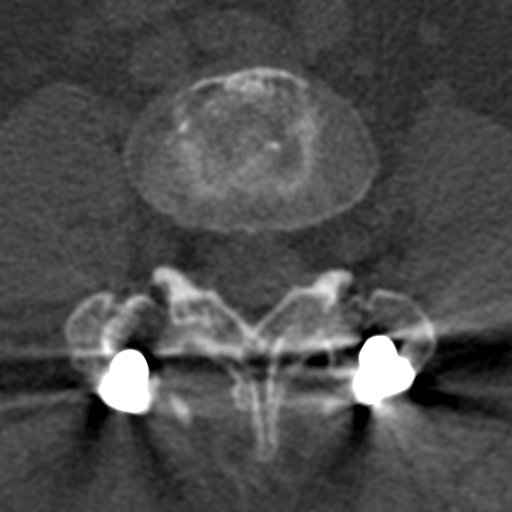
[im 78/117  bone]
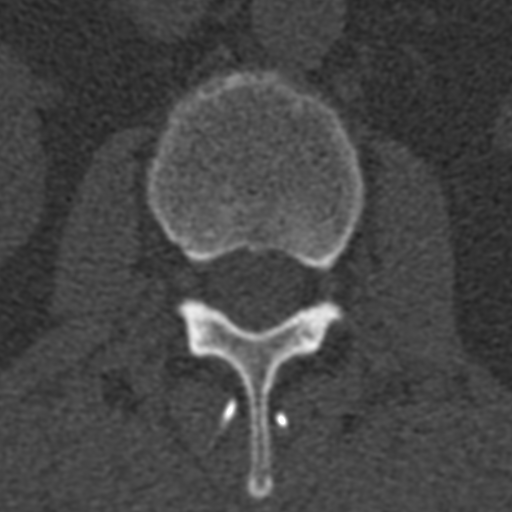
[im 97/117  bone]
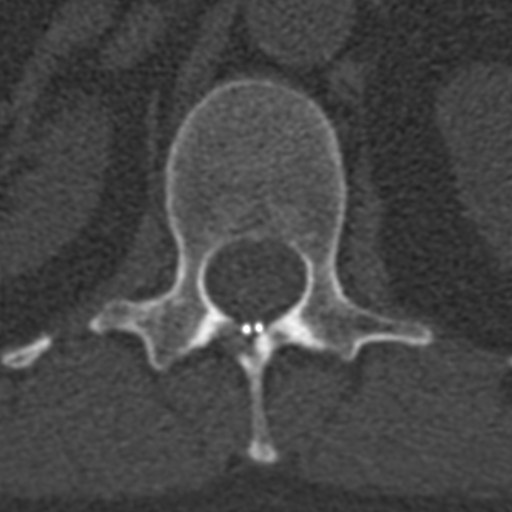

[12 of 33 positions shown; findings below may reference images not displayed]

FINDINGS: Segmentation: 5 lumbar type vertebrae

Alignment: Slight retrolisthesis at L3-4

Vertebrae: No acute fracture or focal pathologic process. L4-S1
fusion with solid arthrodesis.

Paraspinal and other soft tissues: No evidence of inflammation or
mass. Atherosclerotic calcification of the aorta. Dorsal column
stimulator which enters the dorsal canal at L1-2. No unexpected
finding along the leads.

Disc levels: Mild disc narrowing throughout the unfused levels, with
mild retrolisthesis at L3-4. Mild disc bulging.
IMPRESSION: 1. No acute finding or visible impingement.
2. Solid arthrodesis from L4-S1.

## 2021-09-07 IMAGING — US US ABDOMEN LIMITED
1 series · 14 of 25 positions shown · non-contrast
Comparison: None.

CLINICAL DATA: Pain

EXAM:
ULTRASOUND ABDOMEN LIMITED RIGHT UPPER QUADRANT

[Series 1: us abdomen limited · 14 of 31 slices shown]
[im 1/31]
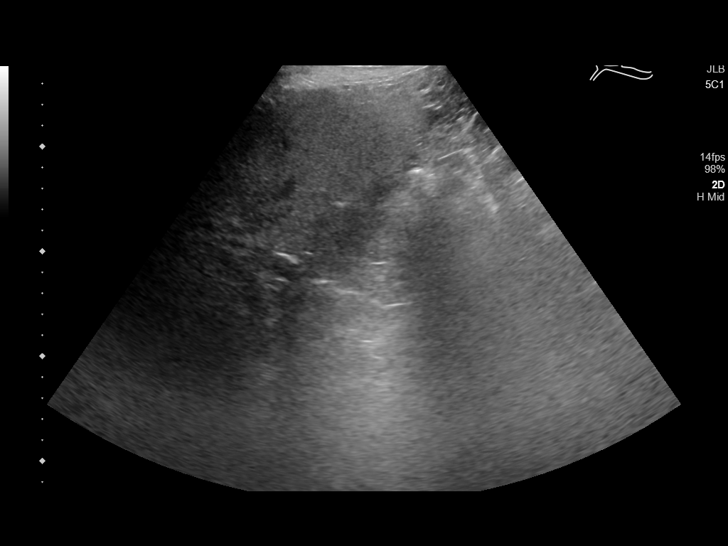
[im 3/31]
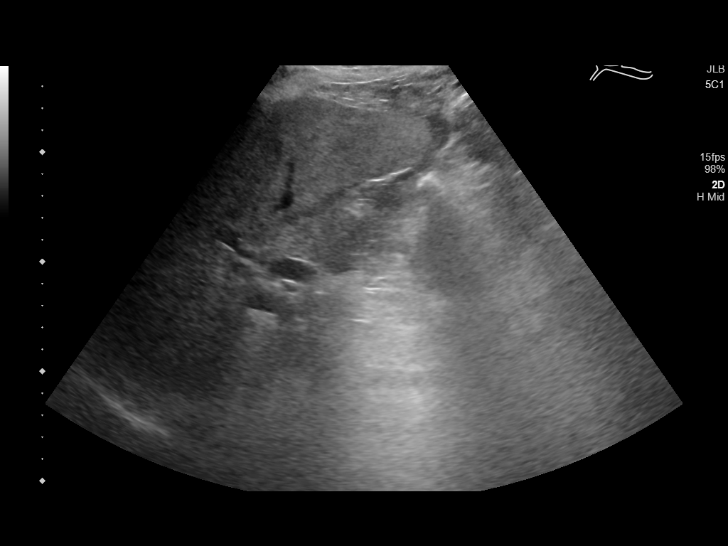
[im 6/31]
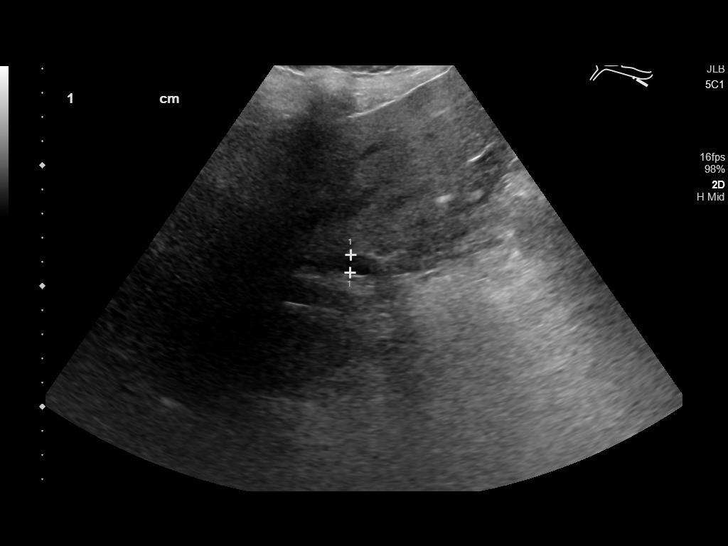
[im 8/31]
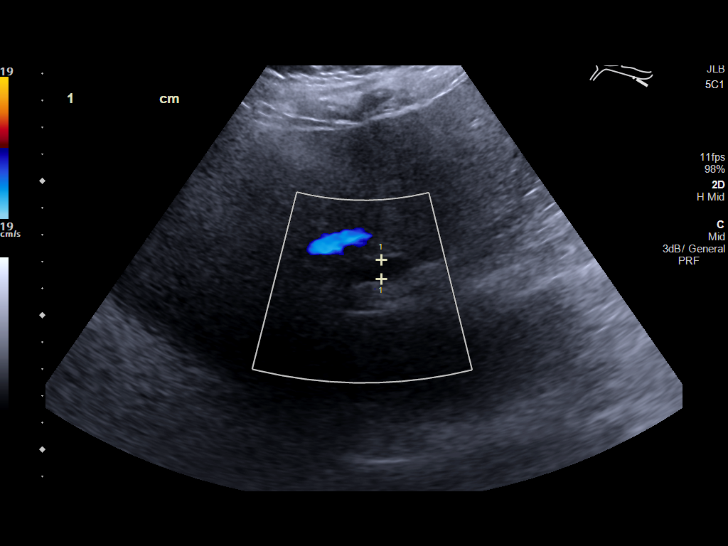
[im 11/31]
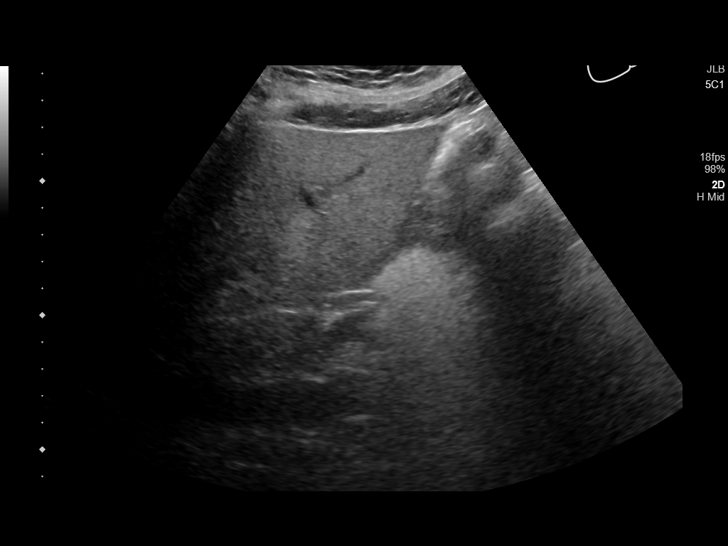
[im 12/31]
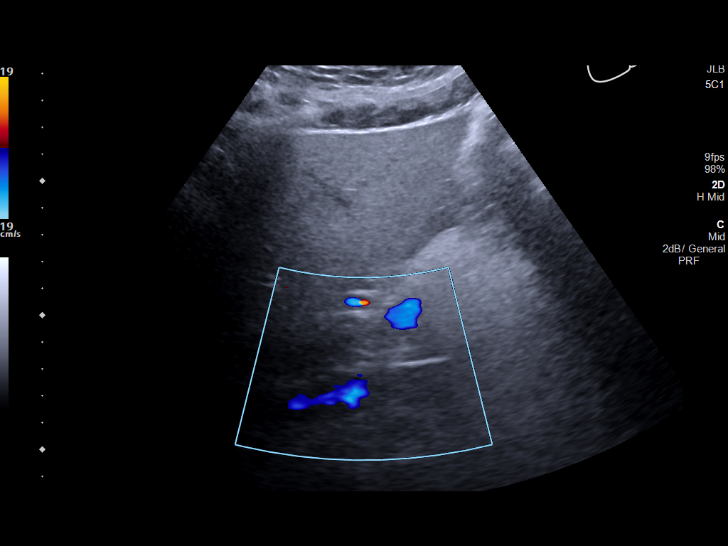
[im 14/31]
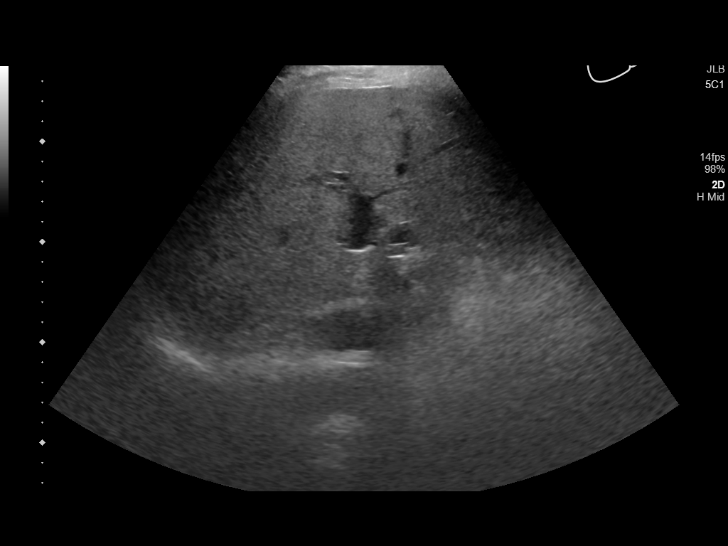
[im 17/31]
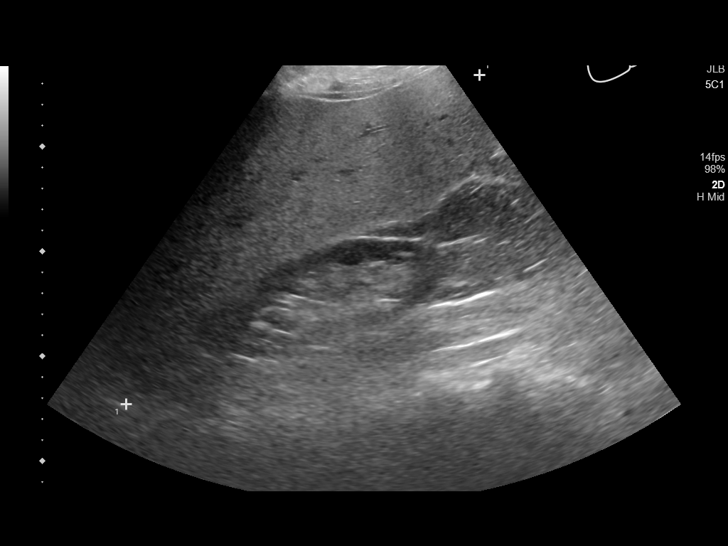
[im 19/31]
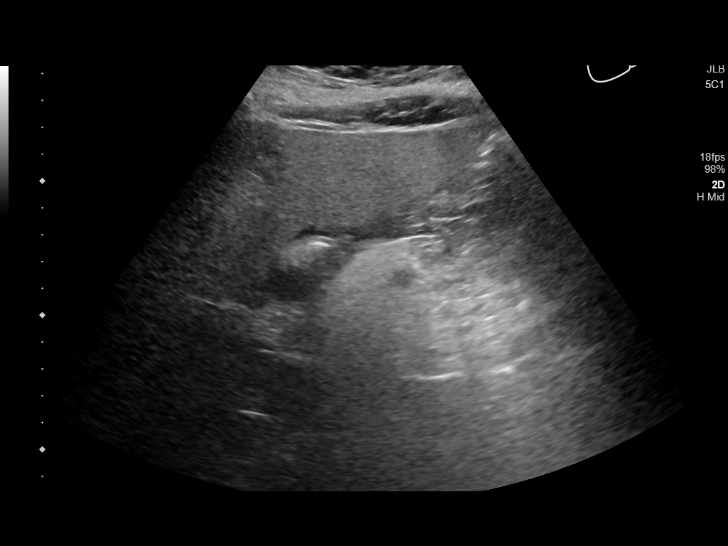
[im 21/31]
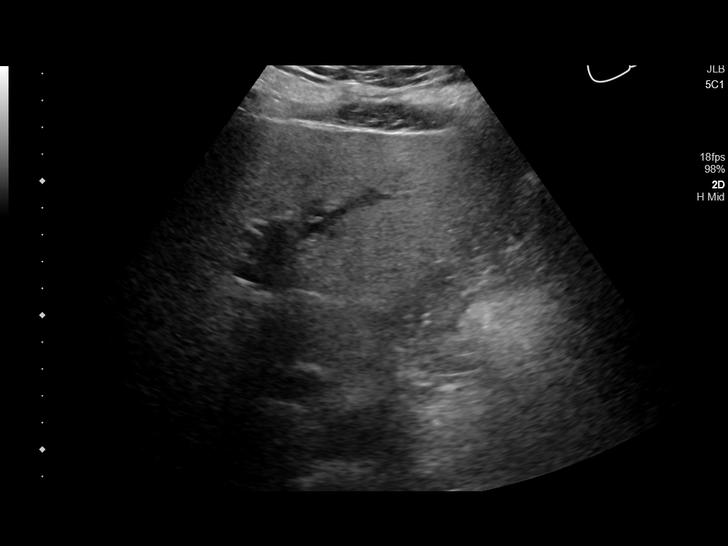
[im 23/31]
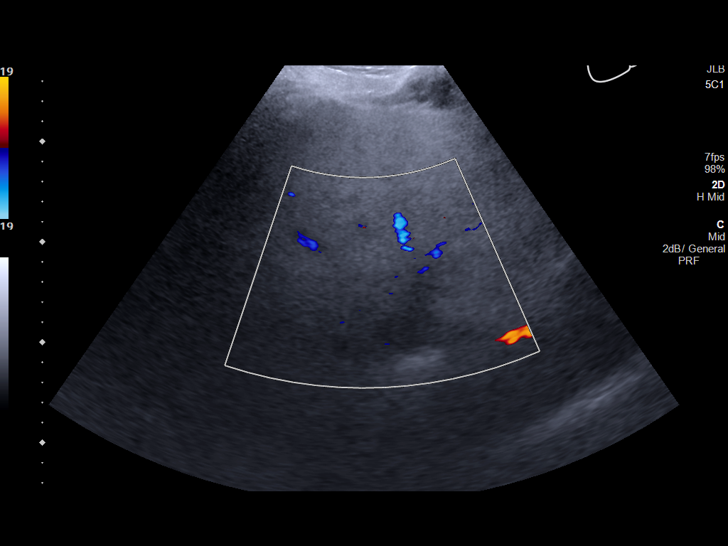
[im 26/31]
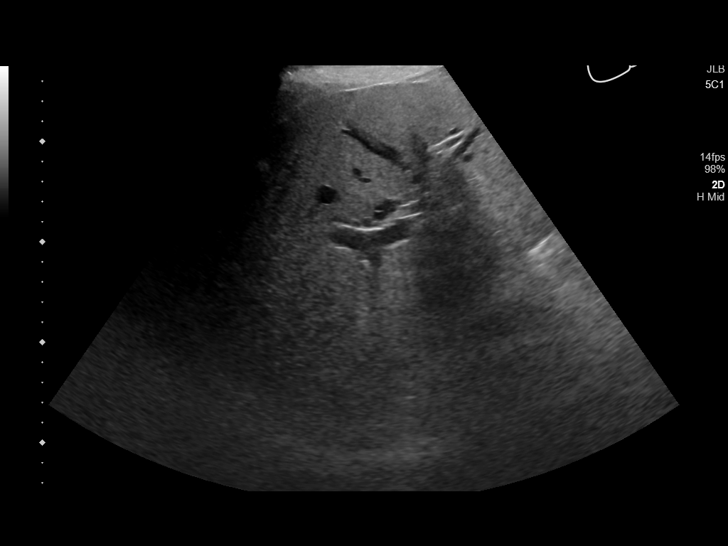
[im 28/31]
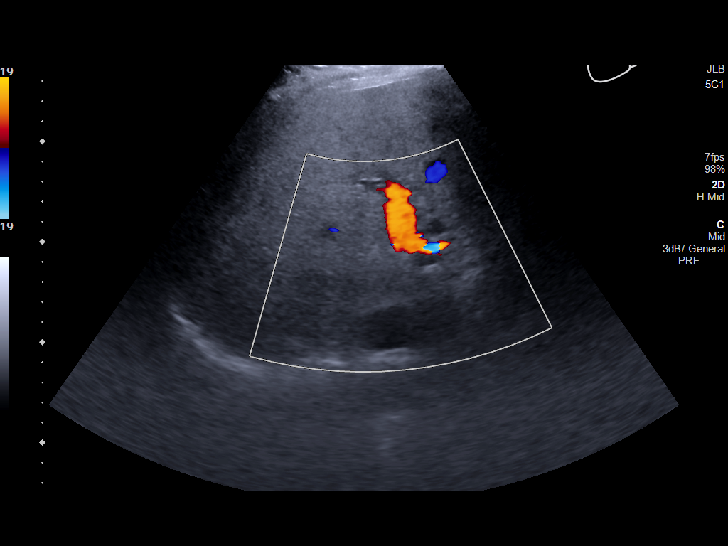
[im 31/31]
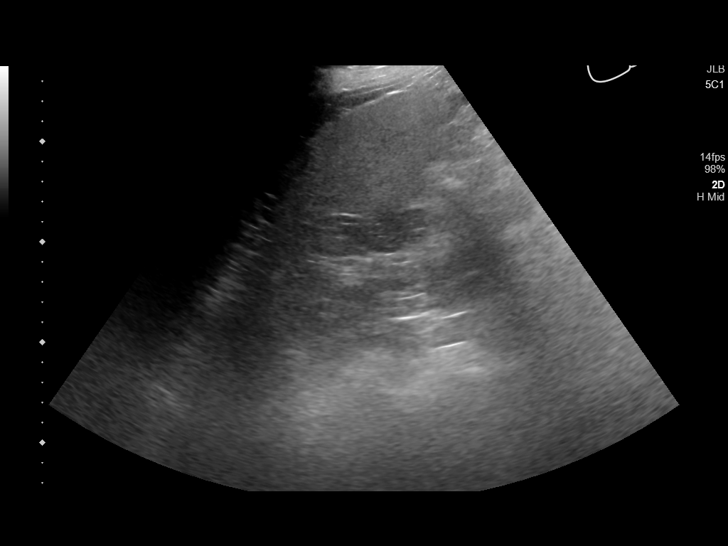

[14 of 25 positions shown; findings below may reference images not displayed]

FINDINGS: Gallbladder:

Surgically absent

Common bile duct:

Diameter: 7 mm

Liver:

Diffuse increased echogenicity with slightly heterogeneous liver.
Appearance typically secondary to fatty infiltration. Fibrosis
secondary consideration. No secondary findings of cirrhosis noted.
No focal hepatic lesion or intrahepatic biliary duct dilatation.
Portal vein is patent on color Doppler imaging with normal direction
of blood flow towards the liver.

Other: None.
IMPRESSION: 1. Status post cholecystectomy.
2. Hepatic steatosis.
